# Patient Record
Sex: Male | Born: 1970 | Race: White | Hispanic: No | Smoking: Current every day smoker
Health system: Southern US, Community
[De-identification: ages and names within clinical notes are randomized; demographics above are authoritative.]

## PROBLEM LIST (undated history)

## (undated) DIAGNOSIS — Z22322 Carrier or suspected carrier of Methicillin resistant Staphylococcus aureus: Secondary | ICD-10-CM

## (undated) DIAGNOSIS — B182 Chronic viral hepatitis C: Secondary | ICD-10-CM

## (undated) DIAGNOSIS — F1110 Opioid abuse, uncomplicated: Secondary | ICD-10-CM

## (undated) DIAGNOSIS — G2581 Restless legs syndrome: Secondary | ICD-10-CM

## (undated) HISTORY — PX: HERNIA REPAIR: SHX51

## (undated) HISTORY — PX: APPENDECTOMY: SHX54

---

## 1999-05-02 ENCOUNTER — Emergency Department (HOSPITAL_COMMUNITY): Admission: EM | Admit: 1999-05-02 | Discharge: 1999-05-02 | Payer: Self-pay | Admitting: *Deleted

## 1999-05-08 ENCOUNTER — Emergency Department (HOSPITAL_COMMUNITY): Admission: EM | Admit: 1999-05-08 | Discharge: 1999-05-08 | Payer: Self-pay | Admitting: *Deleted

## 1999-11-10 ENCOUNTER — Emergency Department (HOSPITAL_COMMUNITY): Admission: EM | Admit: 1999-11-10 | Discharge: 1999-11-10 | Payer: Self-pay | Admitting: Emergency Medicine

## 2000-07-01 ENCOUNTER — Emergency Department (HOSPITAL_COMMUNITY): Admission: EM | Admit: 2000-07-01 | Discharge: 2000-07-01 | Payer: Self-pay | Admitting: Emergency Medicine

## 2001-07-28 ENCOUNTER — Emergency Department (HOSPITAL_COMMUNITY): Admission: EM | Admit: 2001-07-28 | Discharge: 2001-07-28 | Payer: Self-pay | Admitting: Emergency Medicine

## 2001-07-28 ENCOUNTER — Encounter: Payer: Self-pay | Admitting: Emergency Medicine

## 2001-08-16 ENCOUNTER — Emergency Department (HOSPITAL_COMMUNITY): Admission: EM | Admit: 2001-08-16 | Discharge: 2001-08-16 | Payer: Self-pay | Admitting: Emergency Medicine

## 2002-02-14 ENCOUNTER — Emergency Department (HOSPITAL_COMMUNITY): Admission: EM | Admit: 2002-02-14 | Discharge: 2002-02-14 | Payer: Self-pay | Admitting: Emergency Medicine

## 2012-11-03 ENCOUNTER — Encounter (HOSPITAL_COMMUNITY): Payer: Self-pay | Admitting: *Deleted

## 2012-11-03 ENCOUNTER — Emergency Department (INDEPENDENT_AMBULATORY_CARE_PROVIDER_SITE_OTHER)
Admission: EM | Admit: 2012-11-03 | Discharge: 2012-11-03 | Disposition: A | Discharge status: Home / Self Care | Payer: Self-pay | Source: Home / Self Care | Attending: Family Medicine | Admitting: Family Medicine

## 2012-11-03 DIAGNOSIS — H60399 Other infective otitis externa, unspecified ear: Secondary | ICD-10-CM

## 2012-11-03 DIAGNOSIS — K047 Periapical abscess without sinus: Secondary | ICD-10-CM

## 2012-11-03 DIAGNOSIS — H6090 Unspecified otitis externa, unspecified ear: Secondary | ICD-10-CM

## 2012-11-03 MED ORDER — NEOMYCIN-POLYMYXIN-HC 3.5-10000-1 OT SUSP: 4.0000 [drp] | Freq: Three times a day (TID) | OTIC | Status: DC

## 2012-11-03 MED ORDER — TRAMADOL HCL 50 MG PO TABS: 50.0000 mg | ORAL_TABLET | Freq: Four times a day (QID) | ORAL | Status: DC | PRN

## 2012-11-03 MED ORDER — IBUPROFEN 600 MG PO TABS: 600.0000 mg | ORAL_TABLET | Freq: Three times a day (TID) | ORAL | Status: DC

## 2012-11-03 MED ORDER — HYDROCORTISONE-ACETIC ACID 1-2 % OT SOLN: 4.0000 [drp] | Freq: Two times a day (BID) | OTIC | Status: DC

## 2012-11-03 MED ORDER — PENICILLIN V POTASSIUM 250 MG PO TABS: 500.0000 mg | ORAL_TABLET | Freq: Three times a day (TID) | ORAL | Status: AC

## 2012-11-03 NOTE — ED Notes (Signed)
Pt  Reports   Symptoms  Of  Earache     r         Side            As  Well  As   toothache  -   With  Sensation of  Pressure         Symptoms  Of  About 1  Week

## 2012-11-06 NOTE — ED Provider Notes (Signed)
History     CSN: [id number]  Arrival date & time 11/03/12  1238   First MD Initiated Contact with Patient 11/03/12 1301      Chief Complaint  Patient presents with  . Otalgia    (Consider location/radiation/quality/duration/timing/severity/associated sxs/prior treatment) HPI Comments: 42 y/o male here c/o left ear pain and drainage for 1 week. Also c/o dental pain in left lower jaw. Denies fever or chills. No h/o diabetes. No sore throat. No cough or congestion.    History reviewed. No pertinent past medical history.  History reviewed. No pertinent past surgical history.  No family history on file.  History  Substance Use Topics  . Smoking status: Not on file  . Smokeless tobacco: Not on file  . Alcohol Use: Not on file      Review of Systems  Constitutional: Negative for fever and chills.  HENT: Positive for ear pain, dental problem and ear discharge. Negative for congestion, sore throat and neck pain.   Eyes: Negative for discharge.  Respiratory: Negative for cough and shortness of breath.   Cardiovascular: Negative for chest pain.  Skin: Negative for rash.    Allergies  Review of patient's allergies indicates no known allergies.  Home Medications   Current Outpatient Rx  Name  Route  Sig  Dispense  Refill  . acetic acid-hydrocortisone (VOSOL-HC) otic solution   Left Ear   Place 4 drops into the left ear 2 (two) times daily.   10 mL   0   . ibuprofen (ADVIL,MOTRIN) 600 MG tablet   Oral   Take 1 tablet (600 mg total) by mouth 3 (three) times daily.   30 tablet   0   . neomycin-polymyxin-hydrocortisone (CORTISPORIN) 3.5-10000-1 otic suspension   Left Ear   Place 4 drops into the left ear 3 (three) times daily.   10 mL   0   . penicillin v potassium (VEETID) 250 MG tablet   Oral   Take 2 tablets (500 mg total) by mouth 3 (three) times daily.   42 tablet   0   . traMADol (ULTRAM) 50 MG tablet   Oral   Take 1 tablet (50 mg total) by mouth every 6  (six) hours as needed for pain.   15 tablet   0     BP 142/78  Pulse 72  Temp(Src) 98.6 F (37 C) (Oral)  Resp 16  SpO2 96%  Physical Exam  Nursing note and vitals reviewed. Constitutional: He is oriented to person, place, and time. He appears well-developed and well-nourished. No distress.  HENT:  Head: Normocephalic and atraumatic.  Right Ear: External ear normal.  Nose: Nose normal.  Mouth/Throat: Oropharynx is clear and moist.  Left external ear canal with erythema, mild swelling and abundant white/yellow adherent exudates. TM appears intact. No blood in ear canal.  There is a fractured molar with periapical swelling and exudate in left lower jaw.   Eyes: Conjunctivae and EOM are normal. Pupils are equal, round, and reactive to light. Right eye exhibits no discharge. Left eye exhibits no discharge.  Cardiovascular: Normal heart sounds.   Pulmonary/Chest: Effort normal and breath sounds normal. No respiratory distress. He has no wheezes. He has no rales. He exhibits no tenderness.  Lymphadenopathy:    He has no cervical adenopathy.  Neurological: He is alert and oriented to person, place, and time.  Skin: No rash noted.    ED Course  Procedures (including critical care time)  Labs Reviewed - No data to  display No results found.   1. External otitis   2. Dental abscess       MDM   Treated with VOSOL-HC alternating with cortisporin otic drops (sample provided). Penicillin V, ibuprofen and tramadol. reccommended dental follow up.  Supportive care and red flags that should prompt his return to medical attention discussed with patient and provided in writing.         Sharin Grave, MD 11/06/12 1010

## 2013-04-17 ENCOUNTER — Emergency Department (HOSPITAL_COMMUNITY)
Admission: EM | Admit: 2013-04-17 | Discharge: 2013-04-17 | Disposition: A | Discharge status: Home / Self Care | Payer: Self-pay | Attending: Emergency Medicine | Admitting: Emergency Medicine

## 2013-04-17 ENCOUNTER — Emergency Department (HOSPITAL_COMMUNITY): Payer: Self-pay

## 2013-04-17 ENCOUNTER — Encounter (HOSPITAL_COMMUNITY): Payer: Self-pay | Admitting: *Deleted

## 2013-04-17 DIAGNOSIS — F17200 Nicotine dependence, unspecified, uncomplicated: Secondary | ICD-10-CM | POA: Insufficient documentation

## 2013-04-17 DIAGNOSIS — M79609 Pain in unspecified limb: Secondary | ICD-10-CM | POA: Insufficient documentation

## 2013-04-17 DIAGNOSIS — M79671 Pain in right foot: Secondary | ICD-10-CM

## 2013-04-17 DIAGNOSIS — Z791 Long term (current) use of non-steroidal anti-inflammatories (NSAID): Secondary | ICD-10-CM | POA: Insufficient documentation

## 2013-04-17 MED ORDER — OXYCODONE-ACETAMINOPHEN 5-325 MG PO TABS: 2.0000 | ORAL_TABLET | Freq: Four times a day (QID) | ORAL | Status: DC | PRN

## 2013-04-17 MED ORDER — OXYCODONE-ACETAMINOPHEN 5-325 MG PO TABS
2.0000 | ORAL_TABLET | Freq: Once | ORAL | Status: AC
  Administered 2013-04-17: 2 via ORAL
  Filled 2013-04-17: qty 2

## 2013-04-17 NOTE — ED Provider Notes (Signed)
History  This chart was scribed for non-physician practitioner Roxy Horseman, PA-C working with Raeford Razor, MD, by Candelaria Stagers, ED Scribe. This patient was seen in room WTR5/WTR5 and the patient's care was started at 9:16 PM  CSN: [id number] Arrival date & time 04/17/13  2014  First MD Initiated Contact with Patient 04/17/13 2040     Chief Complaint  Patient presents with  . Leg Pain    rt    The history is provided by the patient. No language interpreter was used.   HPI Comments: Joseph Spence is a 42 y.o. male who presents to the Emergency Department complaining of right heel pain after he stomped on his cell phone in anger three days ago.  He reports moderate pain in the heel, as well as new pain to his upper leg, which he describes as sore muscles from "tip-toeing."  He has taken Percocet with relief, but has no remaining pills.  He has also taken ibuprofen and tylenol with no relief.   History reviewed. No pertinent past medical history. Past Surgical History  Procedure Laterality Date  . Appendectomy    . Hernia repair     No family history on file. History  Substance Use Topics  . Smoking status: Current Every Day Smoker  . Smokeless tobacco: Not on file  . Alcohol Use: No    Review of Systems  Musculoskeletal: Positive for arthralgias (right heel pain).  All other systems reviewed and are negative.    Allergies  Review of patient's allergies indicates no known allergies.  Home Medications   Current Outpatient Rx  Name  Route  Sig  Dispense  Refill  . acetaminophen (TYLENOL) 500 MG tablet   Oral   Take 500 mg by mouth every 6 (six) hours as needed for pain (pain).         Marland Kitchen ibuprofen (ADVIL,MOTRIN) 600 MG tablet   Oral   Take 1 tablet (600 mg total) by mouth 3 (three) times daily.   30 tablet   0    BP 124/80  Pulse 85  Temp(Src) 99.1 F (37.3 C) (Oral)  Resp 19  Ht 6\' 1"  (1.854 m)  Wt 150 lb (68.04 kg)  BMI 19.79 kg/m2  SpO2  96% Physical Exam  Nursing note and vitals reviewed. Constitutional: He is oriented to person, place, and time. He appears well-developed and well-nourished. No distress.  HENT:  Head: Normocephalic and atraumatic.  Eyes: EOM are normal.  Neck: Neck supple. No tracheal deviation present.  Cardiovascular: Normal rate and intact distal pulses.   Brisk cap refill   Pulmonary/Chest: Effort normal. No respiratory distress.  Musculoskeletal: Normal range of motion.  Calcaneal bump test positive.  Mild tenderness of the right ankle.  No swelling or bony abnormality or deformity.  Upper thigh mildly tender to palpation, but no swelling or abnormalities.  Neurological: He is alert and oriented to person, place, and time.  Skin: Skin is warm and dry.  Psychiatric: He has a normal mood and affect. His behavior is normal.    ED Course  Procedures  DIAGNOSTIC STUDIES: Oxygen Saturation is 96% on room air, normal by my interpretation.    COORDINATION OF CARE:  9:19 PM Discussed course of care with pt which includes boot and crutches, pain medication, and applying ice.  Pt request a work note.  Will provide referral to orthopaedist is sx persist.  Pt understands and agrees.   Labs Reviewed - No data to display Dg Ankle  Complete Right  04/17/2013   *RADIOLOGY REPORT*  Clinical Data: Heel pain.  Right ankle pain.  RIGHT ANKLE - COMPLETE 3+ VIEW  Comparison: None.  Findings: No acute bone or soft tissue abnormalities are present.  IMPRESSION: Negative right ankle and calcaneus.   Original Report Authenticated By: Marin Roberts, M.D.   1. Heel pain, right     MDM  Patient with heel pain, likely bruised. No fractures.  Will give crutches and pain meds.  Recommend RICE.  Patient is stable and ready for discharge.  I personally performed the services described in this documentation, which was scribed in my presence. The recorded information has been reviewed and is accurate.     Roxy Horseman, PA-C 04/17/13 2150

## 2013-04-17 NOTE — ED Notes (Signed)
Rt heel and leg pain x 3 days, "got mad and stomped cell phone with heel" now has heel pain and upper leg muscle pain

## 2013-04-21 NOTE — ED Provider Notes (Signed)
Medical screening examination/treatment/procedure(s) were performed by non-physician practitioner and as supervising physician I was immediately available for consultation/collaboration.  Raeford Razor, MD 04/21/13 [phone]

## 2013-04-22 ENCOUNTER — Encounter (HOSPITAL_COMMUNITY): Payer: Self-pay | Admitting: Emergency Medicine

## 2013-04-22 ENCOUNTER — Emergency Department (HOSPITAL_COMMUNITY): Payer: Self-pay

## 2013-04-22 ENCOUNTER — Emergency Department (HOSPITAL_COMMUNITY)
Admission: EM | Admit: 2013-04-22 | Discharge: 2013-04-22 | Disposition: A | Discharge status: Home / Self Care | Payer: Self-pay | Attending: Emergency Medicine | Admitting: Emergency Medicine

## 2013-04-22 DIAGNOSIS — X503XXA Overexertion from repetitive movements, initial encounter: Secondary | ICD-10-CM | POA: Insufficient documentation

## 2013-04-22 DIAGNOSIS — S92001S Unspecified fracture of right calcaneus, sequela: Secondary | ICD-10-CM

## 2013-04-22 DIAGNOSIS — Y9389 Activity, other specified: Secondary | ICD-10-CM | POA: Insufficient documentation

## 2013-04-22 DIAGNOSIS — F17200 Nicotine dependence, unspecified, uncomplicated: Secondary | ICD-10-CM | POA: Insufficient documentation

## 2013-04-22 DIAGNOSIS — Z79899 Other long term (current) drug therapy: Secondary | ICD-10-CM | POA: Insufficient documentation

## 2013-04-22 DIAGNOSIS — S92009A Unspecified fracture of unspecified calcaneus, initial encounter for closed fracture: Secondary | ICD-10-CM | POA: Insufficient documentation

## 2013-04-22 DIAGNOSIS — Y929 Unspecified place or not applicable: Secondary | ICD-10-CM | POA: Insufficient documentation

## 2013-04-22 MED ORDER — OXYCODONE-ACETAMINOPHEN 5-325 MG PO TABS
2.0000 | ORAL_TABLET | Freq: Once | ORAL | Status: AC
  Administered 2013-04-22: 2 via ORAL
  Filled 2013-04-22: qty 2

## 2013-04-22 MED ORDER — OXYCODONE-ACETAMINOPHEN 5-325 MG PO TABS: 2.0000 | ORAL_TABLET | Freq: Four times a day (QID) | ORAL | Status: DC | PRN

## 2013-04-22 NOTE — ED Provider Notes (Signed)
Medical screening examination/treatment/procedure(s) were performed by non-physician practitioner and as supervising physician I was immediately available for consultation/collaboration.   Deitrick Ferreri, MD 04/22/13 1844 

## 2013-04-22 NOTE — ED Notes (Signed)
Pt has rt heel pain and was seen for this on the 20th with no relief, denies any injury.

## 2013-04-22 NOTE — ED Provider Notes (Signed)
CSN: [id number]     Arrival date & time 04/22/13  1446 History    This chart was scribed for a non-physician practitioner, Roxy Horseman, PA-C, working with Rolan Bucco, MD by Frederik Pear, ED Scribe. This patient was seen in room WTR8/WTR8 and the patient's care was started at 1503.     First MD Initiated Contact with Patient 04/22/13 1503     Chief Complaint  Patient presents with  . Foot Pain   (Consider location/radiation/quality/duration/timing/severity/associated sxs/prior Treatment) The history is provided by the patient and medical records. No language interpreter was used.    HPI Comments: Joseph Spence is a 42 y.o. male who presents to the Emergency Department complaining of ongoing, constant, severe right foot pain that began at the bottom of his heel and has since spread up to his ankle after he stomped on his phone in anger 8 days ago. He denies new injury or trauma to the foot. He reports he has been elevating his foot, applying ice, using crutches, and using the brace he was given during his ED visit on 06/20. He reports he contacted an orthopedist following his last visit, but is not able to afford the visit.   No past medical history on file. Past Surgical History  Procedure Laterality Date  . Appendectomy    . Hernia repair     No family history on file. History  Substance Use Topics  . Smoking status: Current Every Day Smoker  . Smokeless tobacco: Not on file  . Alcohol Use: No    Review of Systems A complete 10 system review of systems was obtained and all systems are negative except as noted in the HPI and PMH.  Allergies  Review of patient's allergies indicates no known allergies.  Home Medications   Current Outpatient Rx  Name  Route  Sig  Dispense  Refill  . acetaminophen (TYLENOL) 500 MG tablet   Oral   Take 500 mg by mouth every 6 (six) hours as needed for pain (pain).         Marland Kitchen ibuprofen (ADVIL,MOTRIN) 600 MG tablet   Oral   Take 1  tablet (600 mg total) by mouth 3 (three) times daily.   30 tablet   0   . oxyCODONE-acetaminophen (PERCOCET/ROXICET) 5-325 MG per tablet   Oral   Take 2 tablets by mouth every 6 (six) hours as needed for pain.   12 tablet   0    BP 126/87  Pulse 75  Temp(Src) 98.8 F (37.1 C) (Oral)  Resp 14  SpO2 99% Physical Exam  Nursing note and vitals reviewed. Constitutional: He is oriented to person, place, and time. He appears well-developed and well-nourished. No distress.  HENT:  Head: Normocephalic and atraumatic.  Eyes: EOM are normal. Pupils are equal, round, and reactive to light.  Neck: Normal range of motion. Neck supple. No tracheal deviation present.  Cardiovascular: Normal rate and intact distal pulses.   Brisk capillary refill in the right great toe.   Pulmonary/Chest: Effort normal. No respiratory distress.  Abdominal: Soft. He exhibits no distension.  Musculoskeletal: Normal range of motion. He exhibits tenderness. He exhibits no edema.  Right ankle is moderately swollen and diffusely tender to palpation.   Neurological: He is alert and oriented to person, place, and time.  Skin: Skin is warm and dry.  Psychiatric: He has a normal mood and affect. His behavior is normal.   ED Course   Procedures (including critical care time)  DIAGNOSTIC  STUDIES: Oxygen Saturation is 99% on room air, normal by my interpretation.    COORDINATION OF CARE:  15:45- Discussed planned course of treatment with the patient, including a repeat X-ray of the right foot to evaluate for a stress fracture that may have not manifested on prior X-ray, who is agreeable at this time.  16:35- Consult with supervising physician, Dr. Rolan Bucco, to determine if a CT is needed. She does not recommend a CT, but recommends a posterior splint to the right ankle.  16:44- Shared stress fracture found on the X-ray with the pt. Discussed the plan including following up with an orthopedist, refilling the  pain control medication, and applying a posterior ankle brace. He is agreeable at this time.   Labs Reviewed - No data to display Dg Ankle Complete Right  04/22/2013   *RADIOLOGY REPORT*  Clinical Data: Pain at heel, stepped on a hard object 1 week ago  RIGHT ANKLE - COMPLETE 3+ VIEW  Comparison: 04/17/2013 Correlation:  Right calcaneus radiographs07/16/2014  Findings: Osseous mineralization normal. Ankle mortise intact. Question mild soft tissue swelling. Linear lucency identified at the plantar aspect of the posterior calcaneus, not definitely seen on prior foot or ankle radiographs. This could represent an artifact though an atypical fracture is not completely excluded. No additional focal bony abnormalities identified.  IMPRESSION: Short linear lucency newly identified at the plantar aspect of the posterior calcaneus, potentially an artifact though a subtle atypical fracture is not completely excluded.   Original Report Authenticated By: Ulyses Southward, M.D.   1. Calcaneal fracture, right, sequela     MDM  Mild calcaneal fracture.  Posterior splint, pain meds, ortho follow-up.  I personally performed the services described in this documentation, which was scribed in my presence. The recorded information has been reviewed and is accurate.     Roxy Horseman, PA-C 04/22/13 1732

## 2014-03-10 ENCOUNTER — Emergency Department (HOSPITAL_COMMUNITY)
Admission: EM | Admit: 2014-03-10 | Discharge: 2014-03-10 | Disposition: A | Discharge status: Home / Self Care | Payer: Self-pay | Attending: Emergency Medicine | Admitting: Emergency Medicine

## 2014-03-10 ENCOUNTER — Encounter (HOSPITAL_COMMUNITY): Payer: Self-pay | Admitting: Emergency Medicine

## 2014-03-10 DIAGNOSIS — L03317 Cellulitis of buttock: Principal | ICD-10-CM

## 2014-03-10 DIAGNOSIS — L0291 Cutaneous abscess, unspecified: Secondary | ICD-10-CM

## 2014-03-10 DIAGNOSIS — F17200 Nicotine dependence, unspecified, uncomplicated: Secondary | ICD-10-CM | POA: Insufficient documentation

## 2014-03-10 DIAGNOSIS — L0231 Cutaneous abscess of buttock: Secondary | ICD-10-CM | POA: Insufficient documentation

## 2014-03-10 MED ORDER — SULFAMETHOXAZOLE-TRIMETHOPRIM 800-160 MG PO TABS: 1.0000 | ORAL_TABLET | Freq: Two times a day (BID) | ORAL | Status: DC

## 2014-03-10 MED ORDER — CEPHALEXIN 500 MG PO CAPS: 500.0000 mg | ORAL_CAPSULE | Freq: Four times a day (QID) | ORAL | Status: DC

## 2014-03-10 MED ORDER — OXYCODONE-ACETAMINOPHEN 5-325 MG PO TABS
1.0000 | ORAL_TABLET | Freq: Four times a day (QID) | ORAL | Status: DC | PRN
Start: 2014-03-10 — End: 2014-03-20

## 2014-03-10 NOTE — ED Provider Notes (Signed)
CSN: [id number]     Arrival date & time 03/10/14  1647 History  This chart was scribed for non-physician practitioner, Joseph LowerVrinda Kota Ciancio, FNP,working with Joseph ArgyleForrest S Harrison, MD, by Joseph PlumberJennifer Spence, ED Scribe.  This patient was seen in room WTR5/WTR5 and the patient's care was started at 5:09 PM.  Chief Complaint  Patient presents with  . Animal Bite   HPI HPI Comments:  Joseph Spence is a 43 y.o. male who presents to the Emergency Department complaining of several bites on his right 5th digit, left leg, buttocks and back. He states he did not see what bit him. He states that the bite on his buttock started out as a rash. He reports picking and squeezing the areas. He denies fever or chills. He states his tetanus vaccination is UTD. He denies any allergies to any medications.  History reviewed. No pertinent past medical history. Past Surgical History  Procedure Laterality Date  . Appendectomy    . Hernia repair     No family history on file. History  Substance Use Topics  . Smoking status: Current Every Day Smoker  . Smokeless tobacco: Not on file  . Alcohol Use: No    Review of Systems  Constitutional: Negative for fever and chills.  Skin: Positive for rash.       Abscesses  All other systems reviewed and are negative.   Allergies  Review of patient's allergies indicates no known allergies.  Home Medications   Prior to Admission medications   Medication Sig Start Date End Date Taking? Authorizing Provider  acetaminophen (TYLENOL) 500 MG tablet Take 500 mg by mouth every 6 (six) hours as needed for pain (pain).    Historical Provider, MD  ibuprofen (ADVIL,MOTRIN) 600 MG tablet Take 1 tablet (600 mg total) by mouth 3 (three) times daily. 11/03/12   Joseph Moreno-Coll, MD  oxyCODONE-acetaminophen (PERCOCET/ROXICET) 5-325 MG per tablet Take 2 tablets by mouth every 6 (six) hours as needed for pain. 04/17/13   Joseph Horsemanobert Browning, PA-C  oxyCODONE-acetaminophen (PERCOCET/ROXICET) 5-325  MG per tablet Take 2 tablets by mouth every 6 (six) hours as needed for pain. 04/22/13   Joseph Horsemanobert Browning, PA-C   Triage Vitals: BP 126/80  Pulse 88  Temp(Src) 98.2 F (36.8 C) (Oral)  Resp 18  SpO2 98% Physical Exam  Nursing note and vitals reviewed. Constitutional: He is oriented to person, place, and time. He appears well-developed and well-nourished.  HENT:  Head: Normocephalic and atraumatic.  Eyes: EOM are normal.  Neck: Normal range of motion.  Cardiovascular: Normal rate.   Pulmonary/Chest: Effort normal.  Musculoskeletal: Normal range of motion.  Neurological: He is alert and oriented to person, place, and time.  Skin: Skin is warm and dry. Rash noted.  Swelling on the dorsal aspect of right hand on proximal area of fifth digit. Small erythematous area on left thigh. Pt has firm erythematous area to the lower left back. Pt has area of raw skin  Psychiatric: He has a normal mood and affect. His behavior is normal.    ED Course  Procedures (including critical care time) DIAGNOSTIC STUDIES: Oxygen Saturation is 98% on RA, normal by my interpretation.   COORDINATION OF CARE: 5:15 PM- Will I & D abscess on buttock. Will prescribe antibiotics and pain medication. Pt verbalizes understanding and agrees to plan.  INCISION AND DRAINAGE PROCEDURE NOTE: Patient identification was confirmed and verbal consent was obtained. This procedure was performed by Joseph LowerVrinda Malacai Grantz, FNP at 5:19 PM. Site: left buttock Sterile procedures  observed Needle size: 25 G Anesthetic used (type and amt): Lidocaine 2% with Epinephrine (2 mL) Blade size: #11 Drainage: small Complexity: Complex Packing used: 1/4 inch Iodoform Site anesthetized, incision made over site, wound drained and explored loculations, rinsed with copious amounts of normal saline, wound packed with sterile gauze, covered with dry, sterile dressing.  Pt tolerated procedure well without complications.  Instructions for  care discussed verbally and pt provided with additional written instructions for homecare and f/u.   Medications - No data to display  Labs Review Labs Reviewed - No data to display  Imaging Review No results found.   EKG Interpretation None      MDM   Final diagnoses:  Abscess    Will treat for infection as erythematous around abscess. Pt has area to the right hand and thigh that appear to be early:no need for abscess  I personally performed the services described in this documentation, which was scribed in my presence. The recorded information has been reviewed and is accurate.    Joseph LowerVrinda Marieanne Marxen, NP 03/10/14 1737

## 2014-03-10 NOTE — ED Notes (Signed)
Per pt, states he noticed a rash last Sunday on his lower back-now has raised area with a black head-several other bites on upper/lower extremities-right hand swollen

## 2014-03-10 NOTE — ED Provider Notes (Signed)
Medical screening examination/treatment/procedure(s) were performed by non-physician practitioner and as supervising physician I was immediately available for consultation/collaboration.   EKG Interpretation None        Remedy Corporan S Diesha Rostad, MD 03/10/14 1847 

## 2014-03-10 NOTE — Discharge Instructions (Signed)
Abscess An abscess is an infected area that contains a collection of pus and debris.It can occur in almost any part of the body. An abscess is also known as a furuncle or boil. CAUSES  An abscess occurs when tissue gets infected. This can occur from blockage of oil or sweat glands, infection of hair follicles, or a minor injury to the skin. As the body tries to fight the infection, pus collects in the area and creates pressure under the skin. This pressure causes pain. People with weakened immune systems have difficulty fighting infections and get certain abscesses more often.  SYMPTOMS Usually an abscess develops on the skin and becomes a painful mass that is red, warm, and tender. If the abscess forms under the skin, you may feel a moveable soft area under the skin. Some abscesses break open (rupture) on their own, but most will continue to get worse without care. The infection can spread deeper into the body and eventually into the bloodstream, causing you to feel ill.  DIAGNOSIS  Your caregiver will take your medical history and perform a physical exam. A sample of fluid may also be taken from the abscess to determine what is causing your infection. TREATMENT  Your caregiver may prescribe antibiotic medicines to fight the infection. However, taking antibiotics alone usually does not cure an abscess. Your caregiver may need to make a small cut (incision) in the abscess to drain the pus. In some cases, gauze is packed into the abscess to reduce pain and to continue draining the area. HOME CARE INSTRUCTIONS   Only take over-the-counter or prescription medicines for pain, discomfort, or fever as directed by your caregiver.  If you were prescribed antibiotics, take them as directed. Finish them even if you start to feel better.  If gauze is used, follow your caregiver's directions for changing the gauze.  To avoid spreading the infection:  Keep your draining abscess covered with a  bandage.  Wash your hands well.  Do not share personal care items, towels, or whirlpools with others.  Avoid skin contact with others.  Keep your skin and clothes clean around the abscess.  Keep all follow-up appointments as directed by your caregiver. SEEK MEDICAL CARE IF:   You have increased pain, swelling, redness, fluid drainage, or bleeding.  You have muscle aches, chills, or a general ill feeling.  You have a fever. MAKE SURE YOU:   Understand these instructions.  Will watch your condition.  Will get help right away if you are not doing well or get worse. Document Released: 06/25/2005 Document Revised: 03/16/2012 Document Reviewed: 11/28/2011 [employer] Patient Information 2014 [employer].  

## 2014-03-12 ENCOUNTER — Inpatient Hospital Stay (HOSPITAL_COMMUNITY)
Admission: EM | Admit: 2014-03-12 | Discharge: 2014-03-20 | DRG: 603 | Disposition: A | Discharge status: Home / Self Care | Payer: Self-pay | Attending: Internal Medicine | Admitting: Internal Medicine

## 2014-03-12 ENCOUNTER — Encounter (HOSPITAL_COMMUNITY): Payer: Self-pay | Admitting: Emergency Medicine

## 2014-03-12 DIAGNOSIS — L0390 Cellulitis, unspecified: Secondary | ICD-10-CM

## 2014-03-12 DIAGNOSIS — L03113 Cellulitis of right upper limb: Secondary | ICD-10-CM | POA: Diagnosis present

## 2014-03-12 DIAGNOSIS — R509 Fever, unspecified: Secondary | ICD-10-CM | POA: Diagnosis present

## 2014-03-12 DIAGNOSIS — E876 Hypokalemia: Secondary | ICD-10-CM | POA: Diagnosis present

## 2014-03-12 DIAGNOSIS — L0291 Cutaneous abscess, unspecified: Secondary | ICD-10-CM

## 2014-03-12 DIAGNOSIS — L0231 Cutaneous abscess of buttock: Secondary | ICD-10-CM | POA: Diagnosis present

## 2014-03-12 DIAGNOSIS — L03011 Cellulitis of right finger: Secondary | ICD-10-CM | POA: Diagnosis present

## 2014-03-12 DIAGNOSIS — L03019 Cellulitis of unspecified finger: Secondary | ICD-10-CM

## 2014-03-12 DIAGNOSIS — L02519 Cutaneous abscess of unspecified hand: Secondary | ICD-10-CM | POA: Diagnosis present

## 2014-03-12 DIAGNOSIS — D638 Anemia in other chronic diseases classified elsewhere: Secondary | ICD-10-CM | POA: Diagnosis present

## 2014-03-12 DIAGNOSIS — F17200 Nicotine dependence, unspecified, uncomplicated: Secondary | ICD-10-CM | POA: Diagnosis present

## 2014-03-12 DIAGNOSIS — A4902 Methicillin resistant Staphylococcus aureus infection, unspecified site: Secondary | ICD-10-CM | POA: Diagnosis present

## 2014-03-12 DIAGNOSIS — L03119 Cellulitis of unspecified part of limb: Principal | ICD-10-CM

## 2014-03-12 DIAGNOSIS — L03317 Cellulitis of buttock: Secondary | ICD-10-CM

## 2014-03-12 DIAGNOSIS — Z807 Family history of other malignant neoplasms of lymphoid, hematopoietic and related tissues: Secondary | ICD-10-CM

## 2014-03-12 DIAGNOSIS — A419 Sepsis, unspecified organism: Secondary | ICD-10-CM | POA: Diagnosis present

## 2014-03-12 DIAGNOSIS — F1111 Opioid abuse, in remission: Secondary | ICD-10-CM | POA: Diagnosis present

## 2014-03-12 DIAGNOSIS — B182 Chronic viral hepatitis C: Secondary | ICD-10-CM | POA: Diagnosis present

## 2014-03-12 DIAGNOSIS — L02419 Cutaneous abscess of limb, unspecified: Principal | ICD-10-CM | POA: Diagnosis present

## 2014-03-12 DIAGNOSIS — R7989 Other specified abnormal findings of blood chemistry: Secondary | ICD-10-CM | POA: Diagnosis present

## 2014-03-12 LAB — COMPREHENSIVE METABOLIC PANEL
ALT: 69 U/L — AB (ref 0–53)
AST: 50 U/L — ABNORMAL HIGH (ref 0–37)
Albumin: 3 g/dL — ABNORMAL LOW (ref 3.5–5.2)
Alkaline Phosphatase: 70 U/L (ref 39–117)
BUN: 8 mg/dL (ref 6–23)
CALCIUM: 8.5 mg/dL (ref 8.4–10.5)
CO2: 23 mEq/L (ref 19–32)
Chloride: 96 mEq/L (ref 96–112)
Creatinine, Ser: 0.63 mg/dL (ref 0.50–1.35)
GFR calc non Af Amer: >90 mL/min (ref >90)
GLUCOSE: 87 mg/dL (ref 70–99)
Potassium: 3.3 mEq/L — ABNORMAL LOW (ref 3.7–5.3)
SODIUM: 132 meq/L — AB (ref 137–147)
TOTAL PROTEIN: 7 g/dL (ref 6.0–8.3)
Total Bilirubin: 0.5 mg/dL (ref 0.3–1.2)

## 2014-03-12 LAB — CBC WITH DIFFERENTIAL/PLATELET
BASOS ABS: 0 10*3/uL (ref 0.0–0.1)
Basophils Relative: 0 % (ref 0–1)
EOS ABS: 0 10*3/uL (ref 0.0–0.7)
EOS PCT: 0 % (ref 0–5)
HCT: 37.6 % — ABNORMAL LOW (ref 39.0–52.0)
Hemoglobin: 12.6 g/dL — ABNORMAL LOW (ref 13.0–17.0)
LYMPHS ABS: 2.4 10*3/uL (ref 0.7–4.0)
Lymphocytes Relative: 16 % (ref 12–46)
MCH: 31 pg (ref 26.0–34.0)
MCHC: 33.5 g/dL (ref 30.0–36.0)
MCV: 92.4 fL (ref 78.0–100.0)
Monocytes Absolute: 1.5 10*3/uL — ABNORMAL HIGH (ref 0.1–1.0)
Monocytes Relative: 9 % (ref 3–12)
Neutro Abs: 11.5 10*3/uL — ABNORMAL HIGH (ref 1.7–7.7)
Neutrophils Relative %: 75 % (ref 43–77)
PLATELETS: 303 10*3/uL (ref 150–400)
RBC: 4.07 MIL/uL — ABNORMAL LOW (ref 4.22–5.81)
RDW: 13.6 % (ref 11.5–15.5)
WBC: 15.5 10*3/uL — ABNORMAL HIGH (ref 4.0–10.5)

## 2014-03-12 LAB — I-STAT CG4 LACTIC ACID, ED: Lactic Acid, Venous: 0.69 mmol/L (ref 0.5–2.2)

## 2014-03-12 MED ORDER — VANCOMYCIN HCL IN DEXTROSE 1-5 GM/200ML-% IV SOLN
1000.0000 mg | Freq: Three times a day (TID) | INTRAVENOUS | Status: DC
  Administered 2014-03-13 – 2014-03-14 (×5): 1000 mg via INTRAVENOUS
  Filled 2014-03-12 (×5): qty 200

## 2014-03-12 MED ORDER — ACETAMINOPHEN 325 MG PO TABS
650.0000 mg | ORAL_TABLET | Freq: Once | ORAL | Status: AC
Start: 2014-03-12 — End: 2014-03-12
  Administered 2014-03-12: 650 mg via ORAL
  Filled 2014-03-12: qty 2

## 2014-03-12 MED ORDER — ONDANSETRON HCL 4 MG/2ML IJ SOLN
4.0000 mg | Freq: Once | INTRAMUSCULAR | Status: AC
Start: 2014-03-12 — End: 2014-03-12
  Administered 2014-03-12: 4 mg via INTRAVENOUS
  Filled 2014-03-12: qty 2

## 2014-03-12 MED ORDER — SODIUM CHLORIDE 0.9 % IV SOLN
3.0000 g | Freq: Four times a day (QID) | INTRAVENOUS | Status: DC
  Administered 2014-03-13 (×4): 3 g via INTRAVENOUS
  Filled 2014-03-12 (×4): qty 3

## 2014-03-12 MED ORDER — MORPHINE SULFATE 4 MG/ML IJ SOLN
4.0000 mg | Freq: Once | INTRAMUSCULAR | Status: AC
  Administered 2014-03-12: 4 mg via INTRAVENOUS
  Filled 2014-03-12: qty 1

## 2014-03-12 MED ORDER — CLINDAMYCIN PHOSPHATE 600 MG/50ML IV SOLN
600.0000 mg | Freq: Once | INTRAVENOUS | Status: AC
Start: 2014-03-12 — End: 2014-03-12
  Administered 2014-03-12: 600 mg via INTRAVENOUS
  Filled 2014-03-12: qty 50

## 2014-03-12 NOTE — H&P (Addendum)
[facility] History and Physical  Joseph Spence RUE:[id number] DOB: [birth year] DOA: 03/12/2014  Referring physician:  Dr Wilkie Aye PCP: No primary provider on file.   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 He was discharged home on Keflex and Bactrim. Patient reports that 9 days back he had gone camping and shortly after returning from there he noted some erythema and small pustules over the left gluteal area. 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 For past 2 days he also noticed it edema and swelling over his right hand that started over the little finger and extended over the dorsum of his hand. He was unable to flex his fingers and the site became more painful. He also noticed audible he has a redness over his legs forming small pustules for past 2 days. He denies any fever or chills. He denies any sick contacts. Denies any dysuria or urethral discharge. Denies any nausea vomiting or diarrhea. He reports having a monogamous relationship with his wife and being tested for HIV recently. Patient denies headache, dizziness, fever, chills, nausea , vomiting, chest pain, palpitations, SOB, abdominal pain, bowel or urinary symptoms. Denies change in weight or appetite.  Course in the ED Patient was febrile to 100.9 degree Fahrenheit. Remaining vitals were stable. Blood work done showed WBC of 15.5k, hemoglobin of 12.6, hematocrit of 37.6 and platelets of 303. Chemistry showed sodium of 132, potassium of 3.3 with normal renal function. -Left gluteal abscess was I&D by ED physician and wound culture sent. Blood  culture was sent and patient given a dose of IV clindamycin. Hospital is called for admission to medical floor.  Review of Systems:  Constitutional: Denies fever, chills, diaphoresis, appetite change. Reports fatigue.  HEENT: Denies photophobia, eye pain, redness, hearing loss, ear pain, congestion, sore throat, rhinorrhea, sneezing, mouth sores, trouble swallowing, neck pain, neck stiffness  Respiratory: Denies SOB, DOE, cough, chest tightness,  and wheezing.   Cardiovascular: Denies chest pain, palpitations and leg swelling.  Gastrointestinal: Denies nausea, vomiting, abdominal pain, diarrhea, constipation, blood in stool and abdominal distention.  Genitourinary: Denies dysuria, urgency, frequency, hematuria, flank pain and difficulty urinating.  Endocrine: Denies: hot or cold intolerance,  polyuria, polydipsia. Musculoskeletal: Pain and swelling over right hand and right little finger. Pain and redness over left buttock and diffuse small erythema over the legs.  Denies myalgias, back pain, joint swelling, arthralgias and gait problem.  Neurological: Denies dizziness, seizures, syncope, weakness, light-headedness, numbness and headaches.  Hematological: Denies adenopathy.  Psychiatric/Behavioral: Denies  confusion,   History reviewed. No pertinent past medical history. Past Surgical History  Procedure Laterality Date  . Appendectomy    . Hernia repair     Social History:  reports that he has been smoking.  He does not have any smokeless tobacco history on file. He reports that he does not drink alcohol or use illicit drugs.  No Known Allergies  History reviewed. No pertinent family history.  Prior to Admission medications   Medication Sig Start Date End Date Taking? Authorizing Provider  cephALEXin (KEFLEX) 500 MG capsule Take 1  capsule (500 mg total) by mouth 4 (four) times daily. 03/10/14  Yes Teressa LowerVrinda Pickering, NP  oxyCODONE-acetaminophen (PERCOCET/ROXICET) 5-325 MG per tablet Take  1-2 tablets by mouth every 6 (six) hours as needed for moderate pain or severe pain. 03/10/14  Yes Teressa LowerVrinda Pickering, NP  sulfamethoxazole-trimethoprim (SEPTRA DS) 800-160 MG per tablet Take 1 tablet by mouth every 12 (twelve) hours. 03/10/14  Yes Teressa LowerVrinda Pickering, NP     Physical Exam:  Filed Vitals:   03/12/14 1952 03/12/14 2140 03/12/14 2148  BP: 129/79  123/59  Pulse: 92  97  Temp: 99.7 F (37.6 C) 100.9 F (38.3 C)   TempSrc: Oral Oral   Resp: 19  18  Height: 5\' 11"  (1.803 m)    Weight: 68.04 kg (150 lb)    SpO2: 95%  94%    Constitutional: Vital signs reviewed.  Patient is a well-developed and well-nourished in no acute distress. HEENT: no pallor, no icterus, moist oral mucosa, no cervical lymphadenopathy Cardiovascular: RRR, S1 normal, S2 normal, no MRG Chest: CTAB, no wheezes, rales, or rhonchi Abdominal: Soft. Non-tender, non-distended, bowel sounds are normal, no masses, organomegaly, or guarding present.  GU: no CVA tenderness Extremities :Drained abscess over her left gluteal area with surrounding erythema with small pustules around it. Indurated area with Tender to pressure.  Multiple small 5 mm pustules spreading diffusely to the left lower thighs and leg. Has a few similar pustules over the right thigh. Swelling of the right hand a week fluctuation over the dorsum of the right hand with a small pus point without any discharge. Swelling of the right little finger with a small pus point as well . Warm and very tender to palpation. Tender to pressure over right of this joint. Patient unable to fully flex his fingers . No needle marks seen . Neurological: A&O x3, non focal  Labs on Admission:  Basic Metabolic Panel:  Recent Labs Lab 03/12/14 2050  NA 132*  K 3.3*  CL 96  CO2 23  GLUCOSE 87  BUN 8  CREATININE 0.63  CALCIUM 8.5   Liver Function Tests:  Recent Labs Lab 03/12/14 2050  AST 50*  ALT 69*  ALKPHOS 70  BILITOT 0.5  PROT 7.0  ALBUMIN 3.0*   No  results found for this basename: LIPASE, AMYLASE,  in the last 168 hours No results found for this basename: AMMONIA,  in the last 168 hours CBC:  Recent Labs Lab 03/12/14 2050  WBC 15.5*  NEUTROABS 11.5*  HGB 12.6*  HCT 37.6*  MCV 92.4  PLT 303   Cardiac Enzymes: No results found for this basename: CKTOTAL, CKMB, CKMBINDEX, TROPONINI,  in the last 168 hours BNP: No components found with this basename: POCBNP,  CBG: No results found for this basename: GLUCAP,  in the last 168 hours  Radiological Exams on Admission:    Assessment/Plan  Principal Problem: Sepsis with left gluteal abscess and diffuse skin pustules Patient hemodynamically stable for admission to medical floor. --given disseminated areas of pustules and involvement of multiple skin areas with abscesses involving buttock and right hand with hx of IV drug use, staph bacteremia is of concern -Left gluteal abscess drained by ED physician and cultures sent. Blood cultures sent from the ED. Patient received a dose of IV clindamycin. I would place him on IV vancomycin and Unasyn. I would add IV Flagyl for anaerobic coverage as well. Patient denies similar symptoms in the past but does report a history of IV drug use (last used  was 3 months back). -Follow blood culture.  supportive care with IV fluids and Tylenol . Pain control with when necessary Vicodin and IV morphine for severe pain.  -Check gonorhea and Chlamydia in urine, UA , urine cx and urine drug screen. Check HIV ab and hepatitis panel.. -Patient may need surgical consult for evaluation of left gluteal abscess if symptoms not improved or has persistent drainage. Also recommend further w/up and  ID consult depending upon culture results. . Active Problems:     Cellulitis with possible abscess over right hand and little finger  Given swelling and fluctuation and severe tenderness over the area, appears to have underlying abscess as well. I have spoken with hand  surgery consult Dr Mina MarbleWeingold who will see the patient. Hold off on imaging at this time.  Hypokalemia Replenish with oral potassium.  Tobacco abuse Counseled on smoking cessation     Diet:NPO  DVT prophylaxis: sq lovenox   Code Status: full code discussed with wife at bedside Disposition Plan: home once imroved  Zaryiah Barz [facility] Pager [phone]  Total time spent on admission :70 minutes  If 7PM-7AM, please contact night-coverage [email or web address] Password [email or web address] 03/12/2014, 11:52 PM

## 2014-03-12 NOTE — ED Notes (Signed)
Pt aware of need for urine sample.  

## 2014-03-12 NOTE — ED Provider Notes (Signed)
CSN: [id number]     Arrival date & time 03/12/14  1941 History   First MD Initiated Contact with Patient 03/12/14 2013     Chief Complaint  Patient presents with  . Abscess     (Consider location/radiation/quality/duration/timing/severity/associated sxs/prior Treatment) HPI  This 43 year old male with no significant past medical history who presents with recurrent abscess. A shunt was seen and evaluated on Friday at that time he was noted to have multiple areas of induration and redness over his hand, left leg, and buttock. Buttock abscess was I&D at bedside. Patient was given Keflex and Bactrim for evidence of surrounding cellulitis. Patient reports worsening of buttock abscess as well as increasing size of right fifth digit redness. He also reports multiple pustules throughout his body. He denies any fevers. He generally states he doesn't feel well. He denies any urethral discharge or concern for STD. He reports that he is in monogamous relationship.  History reviewed. No pertinent past medical history. Past Surgical History  Procedure Laterality Date  . Appendectomy    . Hernia repair     History reviewed. No pertinent family history. History  Substance Use Topics  . Smoking status: Current Every Day Smoker  . Smokeless tobacco: Not on file  . Alcohol Use: No    Review of Systems  Constitutional: Negative.  Negative for fever and chills.  Respiratory: Negative.  Negative for cough, chest tightness and shortness of breath.   Cardiovascular: Negative.  Negative for chest pain.  Gastrointestinal: Negative.  Negative for nausea, vomiting, abdominal pain and diarrhea.  Genitourinary: Negative.  Negative for dysuria and discharge.  Musculoskeletal: Negative for back pain.  Skin: Positive for color change. Negative for rash.       Multiple worsening abscesses  Neurological: Negative for headaches.  All other systems reviewed and are negative.     Allergies  Review of patient's  allergies indicates no known allergies.  Home Medications   Prior to Admission medications   Medication Sig Start Date End Date Taking? Authorizing Provider  cephALEXin (KEFLEX) 500 MG capsule Take 1 capsule (500 mg total) by mouth 4 (four) times daily. 03/10/14  Yes Teressa LowerVrinda Pickering, NP  oxyCODONE-acetaminophen (PERCOCET/ROXICET) 5-325 MG per tablet Take 1-2 tablets by mouth every 6 (six) hours as needed for moderate pain or severe pain. 03/10/14  Yes Teressa LowerVrinda Pickering, NP  sulfamethoxazole-trimethoprim (SEPTRA DS) 800-160 MG per tablet Take 1 tablet by mouth every 12 (twelve) hours. 03/10/14  Yes Teressa LowerVrinda Pickering, NP   BP 123/59  Pulse 97  Temp(Src) 100.9 F (38.3 C) (Oral)  Resp 18  Ht 5\' 11"  (1.803 m)  Wt 150 lb (68.04 kg)  BMI 20.93 kg/m2  SpO2 94% Physical Exam  Nursing note and vitals reviewed. Constitutional: He is oriented to person, place, and time. No distress.  Poor hygiene, smells of smoke  HENT:  Head: Normocephalic and atraumatic.  Mouth/Throat: Oropharynx is clear and moist.  Eyes: Pupils are equal, round, and reactive to light.  Cardiovascular: Normal rate, regular rhythm and normal heart sounds.   No murmur heard. Pulmonary/Chest: Effort normal and breath sounds normal. No respiratory distress. He has no wheezes.  Abdominal: Soft. Bowel sounds are normal. There is no tenderness. There is no rebound.  Musculoskeletal: He exhibits no edema.  Lymphadenopathy:    He has no cervical adenopathy.  Neurological: He is alert and oriented to person, place, and time.  Skin: Skin is warm and dry.  Abscess #1:, Left buttock just adjacent to the gluteal fold,  large quarter size ulcerative lesion with spontaneous drainage of pus, local induration and cellulitis adjacent to lesion #2: Dime size area of fluctuance just distal to the right fifth MCP joint, open wound noted with spontaneous drainage Multiple other pustules noted with one just proximal to the left knee  Psychiatric:  He has a normal mood and affect.    ED Course  INCISION AND DRAINAGE Date/Time: 03/12/2014 10:37 PM Performed by: Ross MarcusHORTON, Dasie Chancellor, F Authorized by: Ross MarcusHORTON, Adrijana Haros, F Consent: Verbal consent obtained. Risks and benefits: risks, benefits and alternatives were discussed Consent given by: patient Patient identity confirmed: verbally with patient Type: abscess Location: buttock. Anesthesia: local infiltration Local anesthetic: lidocaine 1% without epinephrine Anesthetic total: 10 ml Patient sedated: no Scalpel size: 11 Incision type: single straight Complexity: simple Drainage: purulent Drainage amount: copious Wound treatment: drain placed Packing material: 1/2 in iodoform gauze Patient tolerance: Patient tolerated the procedure well with no immediate complications.   (including critical care time) Labs Review Labs Reviewed  CBC WITH DIFFERENTIAL - Abnormal; Notable for the following:    WBC 15.5 (*)    RBC 4.07 (*)    Hemoglobin 12.6 (*)    HCT 37.6 (*)    Neutro Abs 11.5 (*)    Monocytes Absolute 1.5 (*)    All other components within normal limits  COMPREHENSIVE METABOLIC PANEL - Abnormal; Notable for the following:    Sodium 132 (*)    Potassium 3.3 (*)    Albumin 3.0 (*)    AST 50 (*)    ALT 69 (*)    All other components within normal limits  CULTURE, BLOOD (ROUTINE X 2)  CULTURE, BLOOD (ROUTINE X 2)  GC/CHLAMYDIA PROBE AMP  CULTURE, ROUTINE-ABSCESS  URINALYSIS, ROUTINE W REFLEX MICROSCOPIC  I-STAT CG4 LACTIC ACID, ED    Imaging Review No results found.   EKG Interpretation None      MDM   Final diagnoses:  Abscess and cellulitis  Fever    Patient presents with multiple pustular lesions with the largest on the left buttock. Initial temperature of 99.7. Denies fevers at home. Given the extensive nature of the lesion, concern for bacteremia. The buttock abscess was I&D to the bedside and culture sent. Blood cultures were sent and patient was given  clindamycin. He has failed outpatient Bactrim and Keflex which he states that he is taking as directed. Repeat temperature 100.7. Patient given Tylenol. Lactic acid added as well urinalysis was GC Chlamydia probe.  Leukocytosis to 15.5. Given fever, leukocytosis, and multiple areas of abscess of cellulitis concerning for disseminated disease, will admit for IV antibiotics. May need to broaden to vancomycin but will defer to hospitalist.    Shon Batonourtney F Jonluke Cobbins, MD 03/12/14 2238

## 2014-03-12 NOTE — ED Notes (Addendum)
Dr Dhungel at bedside 

## 2014-03-12 NOTE — ED Notes (Signed)
Pt states that he doesn't have to use the restroom right now but will try again later

## 2014-03-12 NOTE — ED Notes (Signed)
Pt arrived to the ED with a complaint of abscess on the hand and left leg.  Pt was seen here on Friday and had an I&D on his buttocks.  Pt has since developed swelling on his upper left leg and right hand particularly his pinkie finger.

## 2014-03-13 ENCOUNTER — Encounter (HOSPITAL_COMMUNITY)
Admission: EM | Disposition: A | Discharge status: Home / Self Care | Payer: Self-pay | Source: Home / Self Care | Attending: Internal Medicine

## 2014-03-13 ENCOUNTER — Encounter (HOSPITAL_COMMUNITY): Payer: Self-pay | Admitting: General Surgery

## 2014-03-13 ENCOUNTER — Other Ambulatory Visit: Payer: Self-pay | Admitting: Orthopedic Surgery

## 2014-03-13 ENCOUNTER — Inpatient Hospital Stay (HOSPITAL_COMMUNITY): Payer: Self-pay

## 2014-03-13 DIAGNOSIS — L0231 Cutaneous abscess of buttock: Secondary | ICD-10-CM

## 2014-03-13 DIAGNOSIS — L03317 Cellulitis of buttock: Secondary | ICD-10-CM

## 2014-03-13 LAB — BASIC METABOLIC PANEL
BUN: 9 mg/dL (ref 6–23)
CO2: 27 mEq/L (ref 19–32)
Calcium: 8.4 mg/dL (ref 8.4–10.5)
Chloride: 101 mEq/L (ref 96–112)
Creatinine, Ser: 0.72 mg/dL (ref 0.50–1.35)
Glucose, Bld: 100 mg/dL — ABNORMAL HIGH (ref 70–99)
Potassium: 3.5 mEq/L — ABNORMAL LOW (ref 3.7–5.3)
SODIUM: 140 meq/L (ref 137–147)

## 2014-03-13 LAB — CBC
HCT: 36.1 % — ABNORMAL LOW (ref 39.0–52.0)
HEMOGLOBIN: 12.4 g/dL — AB (ref 13.0–17.0)
MCH: 31.3 pg (ref 26.0–34.0)
MCHC: 34.3 g/dL (ref 30.0–36.0)
MCV: 91.2 fL (ref 78.0–100.0)
PLATELETS: 297 10*3/uL (ref 150–400)
RBC: 3.96 MIL/uL — ABNORMAL LOW (ref 4.22–5.81)
RDW: 13.4 % (ref 11.5–15.5)
WBC: 13.7 10*3/uL — ABNORMAL HIGH (ref 4.0–10.5)

## 2014-03-13 LAB — SEDIMENTATION RATE: SED RATE: 31 mm/h — AB (ref 0–16)

## 2014-03-13 LAB — URINALYSIS, ROUTINE W REFLEX MICROSCOPIC
BILIRUBIN URINE: NEGATIVE
Glucose, UA: NEGATIVE mg/dL
Hgb urine dipstick: NEGATIVE
Ketones, ur: NEGATIVE mg/dL
LEUKOCYTES UA: NEGATIVE
NITRITE: NEGATIVE
PH: 6.5 (ref 5.0–8.0)
Protein, ur: NEGATIVE mg/dL
SPECIFIC GRAVITY, URINE: 1.01 (ref 1.005–1.030)
Urobilinogen, UA: 1 mg/dL (ref 0.0–1.0)

## 2014-03-13 LAB — HEPATITIS PANEL, ACUTE
HCV AB: REACTIVE — AB
HEP A IGM: NONREACTIVE
HEP B C IGM: NONREACTIVE
HEP B S AG: NEGATIVE

## 2014-03-13 LAB — SURGICAL PCR SCREEN
MRSA, PCR: POSITIVE — AB
STAPHYLOCOCCUS AUREUS: POSITIVE — AB

## 2014-03-13 LAB — C-REACTIVE PROTEIN: CRP: 9.8 mg/dL — AB (ref <0.60)

## 2014-03-13 LAB — GC/CHLAMYDIA PROBE AMP
CT PROBE, AMP APTIMA: NEGATIVE
GC PROBE AMP APTIMA: NEGATIVE

## 2014-03-13 LAB — HIV ANTIBODY (ROUTINE TESTING W REFLEX): HIV: NONREACTIVE

## 2014-03-13 SURGERY — IRRIGATION AND DEBRIDEMENT EXTREMITY
Anesthesia: General | Laterality: Right

## 2014-03-13 MED ORDER — ONDANSETRON HCL 4 MG PO TABS: 4.0000 mg | ORAL_TABLET | Freq: Four times a day (QID) | ORAL | Status: DC | PRN

## 2014-03-13 MED ORDER — POTASSIUM CHLORIDE CRYS ER 20 MEQ PO TBCR
40.0000 meq | EXTENDED_RELEASE_TABLET | Freq: Once | ORAL | Status: DC
  Filled 2014-03-13: qty 2

## 2014-03-13 MED ORDER — ACETAMINOPHEN 650 MG RE SUPP: 650.0000 mg | Freq: Four times a day (QID) | RECTAL | Status: DC | PRN

## 2014-03-13 MED ORDER — HYDROCODONE-ACETAMINOPHEN 5-325 MG PO TABS
2.0000 | ORAL_TABLET | ORAL | Status: DC | PRN
  Administered 2014-03-13 – 2014-03-20 (×25): 2 via ORAL
  Filled 2014-03-13 (×25): qty 2

## 2014-03-13 MED ORDER — ENOXAPARIN SODIUM 40 MG/0.4ML ~~LOC~~ SOLN
40.0000 mg | Freq: Every day | SUBCUTANEOUS | Status: DC
  Administered 2014-03-13 – 2014-03-19 (×8): 40 mg via SUBCUTANEOUS
  Filled 2014-03-13 (×9): qty 0.4

## 2014-03-13 MED ORDER — NICOTINE 14 MG/24HR TD PT24
14.0000 mg | MEDICATED_PATCH | Freq: Every day | TRANSDERMAL | Status: DC
  Administered 2014-03-13 – 2014-03-20 (×8): 14 mg via TRANSDERMAL
  Filled 2014-03-13 (×8): qty 1

## 2014-03-13 MED ORDER — ONDANSETRON HCL 4 MG/2ML IJ SOLN: 4.0000 mg | Freq: Four times a day (QID) | INTRAMUSCULAR | Status: DC | PRN

## 2014-03-13 MED ORDER — MORPHINE SULFATE 2 MG/ML IJ SOLN
2.0000 mg | INTRAMUSCULAR | Status: DC | PRN
  Administered 2014-03-13 – 2014-03-17 (×15): 2 mg via INTRAVENOUS
  Filled 2014-03-13 (×15): qty 1

## 2014-03-13 MED ORDER — METRONIDAZOLE IN NACL 5-0.79 MG/ML-% IV SOLN
500.0000 mg | Freq: Three times a day (TID) | INTRAVENOUS | Status: DC
  Administered 2014-03-13 – 2014-03-20 (×23): 500 mg via INTRAVENOUS
  Filled 2014-03-13 (×24): qty 100

## 2014-03-13 MED ORDER — ACETAMINOPHEN 325 MG PO TABS
650.0000 mg | ORAL_TABLET | Freq: Four times a day (QID) | ORAL | Status: DC | PRN
  Administered 2014-03-17: 650 mg via ORAL
  Filled 2014-03-13: qty 2

## 2014-03-13 MED ORDER — PIPERACILLIN-TAZOBACTAM 3.375 G IVPB
3.3750 g | Freq: Three times a day (TID) | INTRAVENOUS | Status: DC
  Administered 2014-03-13 – 2014-03-20 (×20): 3.375 g via INTRAVENOUS
  Filled 2014-03-13 (×21): qty 50

## 2014-03-13 MED ORDER — MORPHINE SULFATE 2 MG/ML IJ SOLN
2.0000 mg | Freq: Four times a day (QID) | INTRAMUSCULAR | Status: DC | PRN
  Administered 2014-03-13 (×2): 2 mg via INTRAVENOUS
  Filled 2014-03-13 (×2): qty 1

## 2014-03-13 MED ORDER — SODIUM CHLORIDE 0.9 % IV SOLN
INTRAVENOUS | Status: DC
  Administered 2014-03-13 – 2014-03-17 (×4): via INTRAVENOUS
  Administered 2014-03-18: 75 mL/h via INTRAVENOUS
  Administered 2014-03-19 (×2): via INTRAVENOUS

## 2014-03-13 MED ORDER — HYDROCODONE-ACETAMINOPHEN 5-325 MG PO TABS
2.0000 | ORAL_TABLET | Freq: Four times a day (QID) | ORAL | Status: DC | PRN
  Administered 2014-03-13 (×2): 2 via ORAL
  Filled 2014-03-13 (×2): qty 2

## 2014-03-13 NOTE — Progress Notes (Signed)
Pt is MRSA positive via PCR.  Notified MD, placed on contact precautions.  Spoke with Gen Surgery and Ortho Surg, no OR today, ok to advance diet. Spoke with Dr. Blake DivineAkula regarding pts pain, new orders placed. Also pt requests Nicotine patch. Will cont to assess.

## 2014-03-13 NOTE — Progress Notes (Signed)
ANTIBIOTIC CONSULT NOTE - INITIAL  Pharmacy Consult for Vancomycin/Zosyn Indication: Left Gluteal abscess with sepsis/ R hand cellulitis/absces    No Known Allergies  Patient Measurements: Height: 5\' 11"  (180.3 cm) Weight: 150 lb (68.04 kg) IBW/kg (Calculated) : 75.3   Vital Signs: Temp: 98.4 F (36.9 C) (06/15 1400) Temp src: Oral (06/15 1400) BP: 116/64 mmHg (06/15 1400) Pulse Rate: 75 (06/15 1400) Intake/Output from previous day: 06/14 0701 - 06/15 0700 In: 600 [IV Piggyback:600] Out: 700 [Urine:700] Intake/Output from this shift:    Labs:  Recent Labs  03/12/14 2050 03/13/14 0505 03/13/14 1230  WBC 15.5*  --  13.7*  HGB 12.6*  --  12.4*  PLT 303  --  297  CREATININE 0.63 0.72  --    Estimated Creatinine Clearance: 116.9 ml/min (by C-G formula based on Cr of 0.72). No results found for this basename: VANCOTROUGH, Leodis BinetVANCOPEAK, VANCORANDOM, GENTTROUGH, GENTPEAK, GENTRANDOM, TOBRATROUGH, TOBRAPEAK, TOBRARND, AMIKACINPEAK, AMIKACINTROU, AMIKACIN,  in the last 72 hours   Microbiology: Recent Results (from the past 720 hour(s))  CULTURE, ROUTINE-ABSCESS     Status: None   Collection Time    03/12/14 10:03 PM      Result Value Ref Range Status   Specimen Description WOUND BUTTOCKS   Final   Special Requests NONE   Final   Gram Stain     Final   Value: MODERATE WBC PRESENT, PREDOMINANTLY PMN     RARE SQUAMOUS EPITHELIAL CELLS PRESENT     MODERATE GRAM POSITIVE COCCI IN PAIRS     IN CLUSTERS     Performed at [facility]   Culture PENDING   Incomplete   Report Status PENDING   Incomplete  GC/CHLAMYDIA PROBE AMP     Status: None   Collection Time    03/13/14 12:03 AM      Result Value Ref Range Status   CT Probe RNA NEGATIVE  NEGATIVE Final   GC Probe RNA NEGATIVE  NEGATIVE Final   Comment: (NOTE)                                                                                               **Normal Reference Range: Negative**          Assay performed  using the Gen-Probe APTIMA COMBO2 (R) Assay.     Acceptable specimen types for this assay include APTIMA Swabs (Unisex,     endocervical, urethral, or vaginal), first void urine, and ThinPrep     liquid based cytology samples.     Performed at [facility]  SURGICAL PCR SCREEN     Status: Abnormal   Collection Time    03/13/14  9:56 AM      Result Value Ref Range Status   MRSA, PCR POSITIVE (*) NEGATIVE Final   Comment: RESULT CALLED TO, READ BACK BY AND VERIFIED WITH:     RODRIGUEZ,A @ 1141 ON [id number] BY POTEAT,S   Staphylococcus aureus POSITIVE (*) NEGATIVE Final   Comment:            The Xpert SA Assay (FDA     approved for NASAL  specimens     in patients over 41 years of age),     is one component of     a comprehensive surveillance     program.  Test performance has     been validated by [facility] for patients greater     than or equal to 41 year old.     It is not intended     to diagnose infection nor to     guide or monitor treatment.    Medical History: History reviewed. No pertinent past medical history.  Medications:  Scheduled:  . enoxaparin (LOVENOX) injection  40 mg Subcutaneous QHS  . metronidazole  500 mg Intravenous 3 times per day  . nicotine  14 mg Transdermal Daily  . piperacillin-tazobactam (ZOSYN)  IV  3.375 g Intravenous Q8H  . [START ON 03/14/2014] potassium chloride  40 mEq Oral Once  . vancomycin  1,000 mg Intravenous Q8H   Infusions:  . sodium chloride 75 mL/hr at 03/13/14 0040   PRN: acetaminophen, acetaminophen, HYDROcodone-acetaminophen, morphine injection, ondansetron (ZOFRAN) IV, ondansetron Assessment: 43 yo M with hx IV drug use in past 3 months, admitted with left gluteal abscess/Right hand cellulitis/absecess  with sepsis.  Concern for staph bacteremia, Echo ordered. Broad spectrum antibiotics with Vancomycin/Zosyn/Flagyl.  I&D Planned for 6/16 am.    Antimicrobials  6/14 >> Clindamycin x 1 in ER 6/15 >> Unasyn >>  6/15 6/15 >> Vancomycin >>  6/15 >> Zosyn >>  6/15 >> Flagyl >>   Labs WBC elevated 13.7, improved after start of abx Temp: AF Scr is WNL, CrCl > 100 ml/min   Microbiology  6/14 Blood x 2: Sent 6/14 Wound buttocks: Pending 6/15: MRSA PCR screen: Postive  6/15: GC/Chlamydia: Negative   Goal of Therapy:  Vancomycin trough level 15-20 mcg/ml  Plan:  1.) Continue Vancomycin 1 gram IV q8h 2.) Start Zosyn 3.375 grams IV q8h, infuse over 4 hours 3.) Patient also on flagyl 500 mg IV q8h - Consider discontinue if not for C.diff - patient receiving double anaerobic coverage with flagyl and zosyn.  4.) Monitor renal function, f/u cultures, vancomycin trough as needed 5.) F/u Echo results, r/o staph bacteremia    Malky Rudzinski, Loma MessingMary Patricia PharmD Pager #: [phone] 8:22 PM 03/13/2014

## 2014-03-13 NOTE — Progress Notes (Signed)
ANTIBIOTIC CONSULT NOTE - INITIAL  Pharmacy Consult for vancomycin, unasyn Indication: gluteal abscess and cellulitis   No Known Allergies  Patient Measurements: Height: 5\' 11"  (180.3 cm) Weight: 150 lb (68.04 kg) IBW/kg (Calculated) : 75.3 Adjusted Body Weight:   Vital Signs: Temp: 99.3 F (37.4 C) (06/15 0037) Temp src: Oral (06/15 0037) BP: 119/72 mmHg (06/15 0037) Pulse Rate: 93 (06/15 0037) Intake/Output from previous day: 06/14 0701 - 06/15 0700 In: -  Out: 700 [Urine:700] Intake/Output from this shift: Total I/O In: -  Out: 700 [Urine:700]  Labs:  Recent Labs  03/12/14 2050  WBC 15.5*  HGB 12.6*  PLT 303  CREATININE 0.63   Estimated Creatinine Clearance: 116.9 ml/min (by C-G formula based on Cr of 0.63). No results found for this basename: VANCOTROUGH, Leodis BinetVANCOPEAK, VANCORANDOM, GENTTROUGH, GENTPEAK, GENTRANDOM, TOBRATROUGH, TOBRAPEAK, TOBRARND, AMIKACINPEAK, AMIKACINTROU, AMIKACIN,  in the last 72 hours   Microbiology: Recent Results (from the past 720 hour(s))  CULTURE, ROUTINE-ABSCESS     Status: None   Collection Time    03/12/14 10:03 PM      Result Value Ref Range Status   Specimen Description WOUND BUTTOCKS   Final   Special Requests NONE   Final   Gram Stain     Final   Value: MODERATE WBC PRESENT, PREDOMINANTLY PMN     RARE SQUAMOUS EPITHELIAL CELLS PRESENT     MODERATE GRAM POSITIVE COCCI IN PAIRS     IN CLUSTERS     Performed at [facility]   Culture PENDING   Incomplete   Report Status PENDING   Incomplete    Medical History: History reviewed. No pertinent past medical history.  Medications:  Anti-infectives   Start     Dose/Rate Route Frequency Ordered Stop   03/13/14 0045  metroNIDAZOLE (FLAGYL) IVPB 500 mg     500 mg 100 mL/hr over 60 Minutes Intravenous 3 times per day 03/13/14 0030     03/13/14 0000  vancomycin (VANCOCIN) IVPB 1000 mg/200 mL premix     1,000 mg 200 mL/hr over 60 Minutes Intravenous Every 8 hours  03/12/14 2351     03/13/14 0000  Ampicillin-Sulbactam (UNASYN) 3 g in sodium chloride 0.9 % 100 mL IVPB     3 g 100 mL/hr over 60 Minutes Intravenous Every 6 hours 03/12/14 2354     03/12/14 2045  clindamycin (CLEOCIN) IVPB 600 mg     600 mg 100 mL/hr over 30 Minutes Intravenous  Once 03/12/14 2032 03/12/14 2200     Assessment: Patient with gluteal abscess and cellulitis.  Goal of Therapy:  Vancomycin trough level 15-20 mcg/ml  Plan:  Measure antibiotic drug levels at steady state Follow up culture results Vancomycin 1gm iv q8hr Unasyn 3gm iv q6hr  Darlina GuysGrimsley Jr, Jacquenette ShoneJulian Crowford 03/13/2014,3:37 AM

## 2014-03-13 NOTE — Consult Note (Addendum)
Reason for Consult: left gluteal abscess Referring Physician: Dr. Venia Minks   HPI: Joseph Spence is a healthy 43 year old male with a history significant for IVD use and tobacco use admitted with cellulitis of left buttocks and right thumb.  We have been asked to see the patient for the gluteal abscess.  Duration of symptoms is 9 days.  Onset was gradual.  Coarse is worsened.  Moderate in severity. Initially began as a rash, boil which was pruritic.  The patient scratched the area which further irritated it.  He was seen in the ED on Friday at which point it was I&D.  He was sent home with keflex and bactrim.  He returned with erythema, swelling and pain to his right hand.  He was been seen by Dr. Burney Gauze.  He is on IV antibiotics; Vanc, unasyn, flagyl.  He reports chills, sweats, fever of 100.9.  Denies previous symptoms.  Denies history of diabetes.  White count today has decreased to 13.7K.     History reviewed. No pertinent past medical history.  Past Surgical History  Procedure Laterality Date  . Appendectomy    . Hernia repair      Family History  Problem Relation Age of Onset  . Lymphoma Father     Social History:  reports that he has been smoking Cigarettes.  He has been smoking about 2.00 packs per day. He does not have any smokeless tobacco history on file. He reports that he does not drink alcohol or use illicit drugs.  Allergies: No Known Allergies  Medications:  Scheduled Meds: . ampicillin-sulbactam (UNASYN) IV  3 g Intravenous Q6H  . enoxaparin (LOVENOX) injection  40 mg Subcutaneous QHS  . metronidazole  500 mg Intravenous 3 times per day  . nicotine  14 mg Transdermal Daily  . [START ON 03/14/2014] potassium chloride  40 mEq Oral Once  . vancomycin  1,000 mg Intravenous Q8H   Continuous Infusions: . sodium chloride 75 mL/hr at 03/13/14 0040   PRN Meds:.acetaminophen, acetaminophen, HYDROcodone-acetaminophen, morphine injection, ondansetron (ZOFRAN) IV,  ondansetron   Results for orders placed during the hospital encounter of 03/12/14 (from the past 48 hour(s))  CBC WITH DIFFERENTIAL     Status: Abnormal   Collection Time    03/12/14  8:50 PM      Result Value Ref Range   WBC 15.5 (*) 4.0 - 10.5 K/uL   RBC 4.07 (*) 4.22 - 5.81 MIL/uL   Hemoglobin 12.6 (*) 13.0 - 17.0 g/dL   HCT 37.6 (*) 39.0 - 52.0 %   MCV 92.4  78.0 - 100.0 fL   MCH 31.0  26.0 - 34.0 pg   MCHC 33.5  30.0 - 36.0 g/dL   RDW 13.6  11.5 - 15.5 %   Platelets 303  150 - 400 K/uL   Neutrophils Relative % 75  43 - 77 %   Neutro Abs 11.5 (*) 1.7 - 7.7 K/uL   Lymphocytes Relative 16  12 - 46 %   Lymphs Abs 2.4  0.7 - 4.0 K/uL   Monocytes Relative 9  3 - 12 %   Monocytes Absolute 1.5 (*) 0.1 - 1.0 K/uL   Eosinophils Relative 0  0 - 5 %   Eosinophils Absolute 0.0  0.0 - 0.7 K/uL   Basophils Relative 0  0 - 1 %   Basophils Absolute 0.0  0.0 - 0.1 K/uL  COMPREHENSIVE METABOLIC PANEL     Status: Abnormal   Collection Time  03/12/14  8:50 PM      Result Value Ref Range   Sodium 132 (*) 137 - 147 mEq/L   Potassium 3.3 (*) 3.7 - 5.3 mEq/L   Chloride 96  96 - 112 mEq/L   CO2 23  19 - 32 mEq/L   Glucose, Bld 87  70 - 99 mg/dL   BUN 8  6 - 23 mg/dL   Creatinine, Ser 0.63  0.50 - 1.35 mg/dL   Calcium 8.5  8.4 - 10.5 mg/dL   Total Protein 7.0  6.0 - 8.3 g/dL   Albumin 3.0 (*) 3.5 - 5.2 g/dL   AST 50 (*) 0 - 37 U/L   ALT 69 (*) 0 - 53 U/L   Alkaline Phosphatase 70  39 - 117 U/L   Total Bilirubin 0.5  0.3 - 1.2 mg/dL   GFR calc non Af Amer >90  >90 mL/min   GFR calc Af Amer >90  >90 mL/min   Comment: (NOTE)     The eGFR has been calculated using the CKD EPI equation.     This calculation has not been validated in all clinical situations.     eGFR's persistently <90 mL/min signify possible Chronic Kidney     Disease.  CULTURE, ROUTINE-ABSCESS     Status: None   Collection Time    03/12/14 10:03 PM      Result Value Ref Range   Specimen Description WOUND BUTTOCKS      Special Requests NONE     Gram Stain       Value: MODERATE WBC PRESENT, PREDOMINANTLY PMN     RARE SQUAMOUS EPITHELIAL CELLS PRESENT     MODERATE GRAM POSITIVE COCCI IN PAIRS     IN CLUSTERS     Performed at [facility]   Culture PENDING     Report Status PENDING    HEPATITIS PANEL, ACUTE     Status: Abnormal   Collection Time    03/12/14 10:52 PM      Result Value Ref Range   Hepatitis B Surface Ag NEGATIVE  NEGATIVE   HCV Ab Reactive (*) NEGATIVE   Comment: (NOTE)                                                                               This test is for screening purposes only.  Reactive results should be     confirmed by an alternative method.  Suggest HCV Qualitative, PCR,     test code 83130.  Specimens will be stable for reflex testing up to 3     days after collection.   Hep A IgM NON REACTIVE  NON REACTIVE   Hep B C IgM NON REACTIVE  NON REACTIVE   Comment: (NOTE)     High levels of Hepatitis B Core IgM antibody are detectable     during the acute stage of Hepatitis B. This antibody is used     to differentiate current from past HBV infection.     Performed at [place] [facility], ED     Status: None   Collection Time    03/12/14 10:59 PM  Result Value Ref Range   Lactic Acid, Venous 0.69  0.5 - 2.2 mmol/L  URINALYSIS, ROUTINE W REFLEX MICROSCOPIC     Status: None   Collection Time    03/13/14 12:00 AM      Result Value Ref Range   Color, Urine YELLOW  YELLOW   APPearance CLEAR  CLEAR   Specific Gravity, Urine 1.010  1.005 - 1.030   pH 6.5  5.0 - 8.0   Glucose, UA NEGATIVE  NEGATIVE mg/dL   Hgb urine dipstick NEGATIVE  NEGATIVE   Bilirubin Urine NEGATIVE  NEGATIVE   Ketones, ur NEGATIVE  NEGATIVE mg/dL   Protein, ur NEGATIVE  NEGATIVE mg/dL   Urobilinogen, UA 1.0  0.0 - 1.0 mg/dL   Nitrite NEGATIVE  NEGATIVE   Leukocytes, UA NEGATIVE  NEGATIVE   Comment: MICROSCOPIC NOT DONE ON URINES WITH NEGATIVE PROTEIN, BLOOD,  LEUKOCYTES, NITRITE, OR GLUCOSE <1000 mg/dL.  BASIC METABOLIC PANEL     Status: Abnormal   Collection Time    03/13/14  5:05 AM      Result Value Ref Range   Sodium 140  137 - 147 mEq/L   Comment: DELTA CHECK NOTED     REPEATED TO VERIFY   Potassium 3.5 (*) 3.7 - 5.3 mEq/L   Chloride 101  96 - 112 mEq/L   CO2 27  19 - 32 mEq/L   Glucose, Bld 100 (*) 70 - 99 mg/dL   BUN 9  6 - 23 mg/dL   Creatinine, Ser 0.72  0.50 - 1.35 mg/dL   Calcium 8.4  8.4 - 10.5 mg/dL   GFR calc non Af Amer >90  >90 mL/min   GFR calc Af Amer >90  >90 mL/min   Comment: (NOTE)     The eGFR has been calculated using the CKD EPI equation.     This calculation has not been validated in all clinical situations.     eGFR's persistently <90 mL/min signify possible Chronic Kidney     Disease.  SEDIMENTATION RATE     Status: Abnormal   Collection Time    03/13/14  5:05 AM      Result Value Ref Range   Sed Rate 31 (*) 0 - 16 mm/hr  C-REACTIVE PROTEIN     Status: Abnormal   Collection Time    03/13/14  5:05 AM      Result Value Ref Range   CRP 9.8 (*) <0.60 mg/dL   Comment: Performed at [facility]     Status: Abnormal   Collection Time    03/13/14  9:56 AM      Result Value Ref Range   MRSA, PCR POSITIVE (*) NEGATIVE   Comment: RESULT CALLED TO, READ BACK BY AND VERIFIED WITH:     RODRIGUEZ,A @ 1141 ON [id number] BY POTEAT,S   Staphylococcus aureus POSITIVE (*) NEGATIVE   Comment:            The Xpert SA Assay (FDA     approved for NASAL specimens     in patients over 4 years of age),     is one component of     a comprehensive surveillance     program.  Test performance has     been validated by [employer] for patients greater     than or equal to 58 year old.     It is not intended     to diagnose infection nor  to     guide or monitor treatment.    No results found.  Review of Systems  Cardiovascular: Positive for claudication.  All other systems reviewed and  are negative.  Blood pressure 113/78, pulse 99, temperature 97.6 F (36.4 C), temperature source Oral, resp. rate 20, height 5' 11"  (1.803 m), weight 150 lb (68.04 kg), SpO2 97.00%. Physical Exam  Constitutional: He is oriented to person, place, and time. He appears well-developed and well-nourished. No distress.  HENT:  Head: Normocephalic and atraumatic.  Neck: Normal range of motion. Neck supple.  Cardiovascular: Normal rate, regular rhythm, normal heart sounds and intact distal pulses.  Exam reveals no gallop and no friction rub.   No murmur heard. Respiratory: Effort normal and breath sounds normal. No respiratory distress. He has no wheezes. He has no rales. He exhibits no tenderness.  GI: Soft. Bowel sounds are normal. He exhibits no distension and no mass. There is no tenderness. There is no rebound and no guarding.  Musculoskeletal: Normal range of motion. He exhibits no edema and no tenderness.  Lymphadenopathy:    He has no cervical adenopathy.  Neurological: He is alert and oriented to person, place, and time.  Skin: Skin is warm. He is not diaphoretic.  3x2cm wound, adequately draining, surrounding erythema and induration.    Psychiatric: He has a normal mood and affect. His behavior is normal. Judgment and thought content normal.    Assessment/Plan: Left gluteal abscess: consider changing unasyn, flagyl to zosyn.  Agree with Vancomycin.  The abscess appears to be draining adequately at present time.  He has significant surrounding erythema and induration, will need to monitor this closely as he may need further debridement. If no improvement.  May resume diet, NPO after midnight.   Consider echocardiogram to evaluate for vegetation given drug use history, right hand cellulitis and new left digit erythema, will leave this up to primary service.  Start BID wet to dry dressing changes.  Follow cultures.  Thank you for the consult, will follow along.  Erby Pian  ANP-BC 03/13/2014, 12:36 PM   03/29/14 [place] did not write this consult note.  I personally examined the patient and all aspects of HPI, physical exam and assessment and plan were completed by me, Jessey Huyett, ANP-BC

## 2014-03-13 NOTE — Progress Notes (Signed)
[facility] PROGRESS NOTE  Joseph Spence UJW:[id number] DOB: Jul 08, 1971 DOA: 03/12/2014 PCP: No primary provider on file. 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 He was discharged home on Keflex and Bactrim. Patient reports that 9 days back he had gone camping and shortly after returning from there he noted some erythema and small pustules over the left gluteal area. 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 For past 2 days he also noticed it edema and swelling over his right hand that started over the little finger and extended over the dorsum of his hand. He was unable to flex his fingers and the site became more painful. He reports seeing pustules for the ast 2 days. In ED, he was febrile and he underwent blood cultures and I&D of the left gluteal abscess. He was started on IV antibiotics. Surgery and orthopedics consulted.   Assessment/Plan:  Left gluteal abscess with sepsis: given disseminated areas of pustules and involvement of multiple skin areas with abscesses involving buttock and right hand with hx of IV drug use, staph bacteremia is of concern , echocardiogram ordered as he reports IV drug use 3 months ago. Follow blood cultures.  -Left gluteal abscess drained by ED physician and cultures sent. Blood cultures sent from the ED. Patient received a dose of IV clindamycin.he is currently on  IV vancomycin and IV zosyn with IV flagyl. HIV ab negative. HCV  Antibody positive.  Surgery consulted a nd I&D scheduled for am.     Cellulitis with possible abscess over right hand and little finger  Given swelling and fluctuation and severe tenderness over the area, appears to have  underlying abscess as well.  hand surgery consult Dr Mina MarbleWeingold  will see the patient.  No surgical intervention.   Hypokalemia:  Will be repleted as needed.   Tobacco abuse - nicotine patch ordered.   DVT prophylaxis   Leukocytosis: Secondary to the sepsis.    Anemia- normocytic. Anemia panel will be obtained.    Hepatitis C antibody positive.  Elevated liver function tests - h/o drug abuse, hcv RNA quan ordered.         Code Status: full code Family Communication: NONE AT BEDSIDE Disposition Plan remain inpatient.    Consultants:  Surgery  Hand surgery- orthpedics.   Procedures:  I&D  Antibiotics:  Vancomycin6 /15  Zosyn 6/15  unasyn 6/14 till 6/15  Clindamycin 6/14 one dose  Flagyl 6/15  HPI/Subjective: Pain is better controlled. Wants to eat.   Objective: Filed Vitals:   03/13/14 1400  BP: 116/64  Pulse: 75  Temp: 98.4 F (36.9 C)  Resp: 18    Intake/Output Summary (Last 24 hours) at 03/13/14 1826 Last data filed at 03/13/14 1400  Gross per 24 hour  Intake    600 ml  Output   1400 ml  Net   -800 ml   Filed Weights   03/12/14 1952  Weight: 68.04 kg (150 lb)    Exam:   General:  Alert comfortable,   Cardiovascular: s1s2  Respiratory: ctab  Abdomen: soft NT ND BS+ Musculoskeletal: Multiple small 5 mm pustules spreading diffusely to the left lower thighs and leg.  Swelling of the right hand a week fluctuation over the dorsum of the right hand with a small pus point without any discharge. Swelling of the right little finger with a small pus point as well . Warm and very tender to palpation. Tender to pressure over right of this joint. Patient unable to fully flex his fingers . No needle marks seen .      Data Reviewed: Basic Metabolic Panel:  Recent Labs Lab 03/12/14 2050 03/13/14 0505  NA 132* 140  K 3.3* 3.5*  CL 96 101  CO2 23 27  GLUCOSE 87 100*  BUN 8 9  CREATININE 0.63 0.72  CALCIUM 8.5 8.4   Liver  Function Tests:  Recent Labs Lab 03/12/14 2050  AST 50*  ALT 69*  ALKPHOS 70  BILITOT 0.5  PROT 7.0  ALBUMIN 3.0*   No results found for this basename: LIPASE, AMYLASE,  in the last 168 hours No results found for this basename: AMMONIA,  in the last 168 hours CBC:  Recent Labs Lab 03/12/14 2050 03/13/14 1230  WBC 15.5* 13.7*  NEUTROABS 11.5*  --   HGB 12.6* 12.4*  HCT 37.6* 36.1*  MCV 92.4 91.2  PLT 303 297   Cardiac Enzymes: No results found for this basename: CKTOTAL, CKMB, CKMBINDEX, TROPONINI,  in the last 168 hours BNP (last 3 results) No results found for this basename: PROBNP,  in the last 8760 hours CBG: No results found for this basename: GLUCAP,  in the last 168 hours  Recent Results (from the past 240 hour(s))  CULTURE, ROUTINE-ABSCESS     Status: None   Collection Time    03/12/14 10:03 PM      Result Value Ref Range Status   Specimen Description WOUND BUTTOCKS   Final   Special Requests NONE   Final   Gram Stain     Final   Value: MODERATE WBC PRESENT, PREDOMINANTLY PMN     RARE SQUAMOUS EPITHELIAL CELLS PRESENT     MODERATE GRAM POSITIVE COCCI IN PAIRS     IN CLUSTERS     Performed at [facility]   Culture PENDING   Incomplete   Report Status PENDING   Incomplete  GC/CHLAMYDIA PROBE AMP     Status: None   Collection Time    03/13/14 12:03 AM      Result Value Ref Range Status   CT Probe RNA NEGATIVE  NEGATIVE Final   GC Probe RNA NEGATIVE  NEGATIVE Final   Comment: (NOTE)                                                                                               **Normal Reference Range: Negative**          Assay performed using the Gen-Probe APTIMA COMBO2 (R) Assay.     Acceptable specimen types for this assay include APTIMA Swabs (Unisex,     endocervical, urethral, or vaginal), first void urine, and ThinPrep     liquid based cytology samples.     Performed at [facility]  SURGICAL PCR SCREEN     Status:  Abnormal    Collection Time    03/13/14  9:56 AM      Result Value Ref Range Status   MRSA, PCR POSITIVE (*) NEGATIVE Final   Comment: RESULT CALLED TO, READ BACK BY AND VERIFIED WITH:     RODRIGUEZ,A @ 1141 ON [id number] BY POTEAT,S   Staphylococcus aureus POSITIVE (*) NEGATIVE Final   Comment:            The Xpert SA Assay (FDA     approved for NASAL specimens     in patients over 41 years of age),     is one component of     a comprehensive surveillance     program.  Test performance has     been validated by [facility] for patients greater     than or equal to 41 year old.     It is not intended     to diagnose infection nor to     guide or monitor treatment.     Studies: No results found.  Scheduled Meds: . enoxaparin (LOVENOX) injection  40 mg Subcutaneous QHS  . metronidazole  500 mg Intravenous 3 times per day  . nicotine  14 mg Transdermal Daily  . [START ON 03/14/2014] potassium chloride  40 mEq Oral Once  . vancomycin  1,000 mg Intravenous Q8H   Continuous Infusions: . sodium chloride 75 mL/hr at 03/13/14 0040    Principal Problem:   Abscess, gluteal, left Active Problems:   Fever   Sepsis affecting skin   Hypokalemia   Cellulitis of right hand   Cellulitis of right little finger   Abscess of buttock, left    Time spent: 35 minutes.     [facility] LLCKULA,Terrion Gencarelli  [facility] Pager [phone]. If 7PM-7AM, please contact night-coverage at [email or web address], password [email or web address] 03/13/2014, 6:26 PM  LOS: 1 day

## 2014-03-13 NOTE — Progress Notes (Signed)
Right hand swelling and erythema improved from earlier this AM  No surgical intervention needed at this point in time   Please start warm soapy soaks to right hand and small finger TID for 20 minutes

## 2014-03-13 NOTE — Consult Note (Signed)
Reason for Consult:right hand swelling and erythema Referring Physician: dhungel  Joseph Spence is an 43 y.o. male.  HPI:s/p drainage of gluteal abscess on Friday admitted tonight for worsening of gluteal abscess and right hand swelling History reviewed. No pertinent past medical history.  Past Surgical History  Procedure Laterality Date  . Appendectomy    . Hernia repair      History reviewed. No pertinent family history.  Social History:  reports that he has been smoking.  He does not have any smokeless tobacco history on file. He reports that he does not drink alcohol or use illicit drugs.  Allergies: No Known Allergies  Medications:  Scheduled: . ampicillin-sulbactam (UNASYN) IV  3 g Intravenous Q6H  . enoxaparin (LOVENOX) injection  40 mg Subcutaneous QHS  . metronidazole  500 mg Intravenous 3 times per day  . [START ON 03/14/2014] potassium chloride  40 mEq Oral Once  . vancomycin  1,000 mg Intravenous Q8H    Results for orders placed during the hospital encounter of 03/12/14 (from the past 48 hour(s))  CBC WITH DIFFERENTIAL     Status: Abnormal   Collection Time    03/12/14  8:50 PM      Result Value Ref Range   WBC 15.5 (*) 4.0 - 10.5 K/uL   RBC 4.07 (*) 4.22 - 5.81 MIL/uL   Hemoglobin 12.6 (*) 13.0 - 17.0 g/dL   HCT 37.6 (*) 39.0 - 52.0 %   MCV 92.4  78.0 - 100.0 fL   MCH 31.0  26.0 - 34.0 pg   MCHC 33.5  30.0 - 36.0 g/dL   RDW 13.6  11.5 - 15.5 %   Platelets 303  150 - 400 K/uL   Neutrophils Relative % 75  43 - 77 %   Neutro Abs 11.5 (*) 1.7 - 7.7 K/uL   Lymphocytes Relative 16  12 - 46 %   Lymphs Abs 2.4  0.7 - 4.0 K/uL   Monocytes Relative 9  3 - 12 %   Monocytes Absolute 1.5 (*) 0.1 - 1.0 K/uL   Eosinophils Relative 0  0 - 5 %   Eosinophils Absolute 0.0  0.0 - 0.7 K/uL   Basophils Relative 0  0 - 1 %   Basophils Absolute 0.0  0.0 - 0.1 K/uL  COMPREHENSIVE METABOLIC PANEL     Status: Abnormal   Collection Time    03/12/14  8:50 PM      Result Value  Ref Range   Sodium 132 (*) 137 - 147 mEq/L   Potassium 3.3 (*) 3.7 - 5.3 mEq/L   Chloride 96  96 - 112 mEq/L   CO2 23  19 - 32 mEq/L   Glucose, Bld 87  70 - 99 mg/dL   BUN 8  6 - 23 mg/dL   Creatinine, Ser 0.63  0.50 - 1.35 mg/dL   Calcium 8.5  8.4 - 10.5 mg/dL   Total Protein 7.0  6.0 - 8.3 g/dL   Albumin 3.0 (*) 3.5 - 5.2 g/dL   AST 50 (*) 0 - 37 U/L   ALT 69 (*) 0 - 53 U/L   Alkaline Phosphatase 70  39 - 117 U/L   Total Bilirubin 0.5  0.3 - 1.2 mg/dL   GFR calc non Af Amer >90  >90 mL/min   GFR calc Af Amer >90  >90 mL/min   Comment: (NOTE)     The eGFR has been calculated using the CKD EPI equation.  This calculation has not been validated in all clinical situations.     eGFR's persistently <90 mL/min signify possible Chronic Kidney     Disease.  I-STAT CG4 [facility], ED     Status: None   Collection Time    03/12/14 10:59 PM      Result Value Ref Range   Lactic Acid, Venous 0.69  0.5 - 2.2 mmol/L  URINALYSIS, ROUTINE W REFLEX MICROSCOPIC     Status: None   Collection Time    03/13/14 12:00 AM      Result Value Ref Range   Color, Urine YELLOW  YELLOW   APPearance CLEAR  CLEAR   Specific Gravity, Urine 1.010  1.005 - 1.030   pH 6.5  5.0 - 8.0   Glucose, UA NEGATIVE  NEGATIVE mg/dL   Hgb urine dipstick NEGATIVE  NEGATIVE   Bilirubin Urine NEGATIVE  NEGATIVE   Ketones, ur NEGATIVE  NEGATIVE mg/dL   Protein, ur NEGATIVE  NEGATIVE mg/dL   Urobilinogen, UA 1.0  0.0 - 1.0 mg/dL   Nitrite NEGATIVE  NEGATIVE   Leukocytes, UA NEGATIVE  NEGATIVE   Comment: MICROSCOPIC NOT DONE ON URINES WITH NEGATIVE PROTEIN, BLOOD, LEUKOCYTES, NITRITE, OR GLUCOSE <1000 mg/dL.    No results found.  Review of Systems  All other systems reviewed and are negative.  Blood pressure 101/57, pulse 96, temperature 100 F (37.8 C), temperature source Oral, resp. rate 18, height 5' 11"  (1.803 m), weight 68.04 kg (150 lb), SpO2 95.00%. Physical Exam  Constitutional: He is oriented to person,  place, and time. He appears well-developed and well-nourished.  HENT:  Head: Normocephalic and atraumatic.  Cardiovascular: Normal rate.   Respiratory: Effort normal.  Musculoskeletal:       Right hand: He exhibits tenderness and swelling.  Mild right hand erythema and swelling c/w cellulitis  Neurological: He is alert and oriented to person, place, and time.  Skin: Skin is warm. There is erythema.  Psychiatric: He has a normal mood and affect. His behavior is normal. Judgment and thought content normal.    Assessment/Plan: As above   Exam tonight c/w cellulitis  Would start IV ABX and will watch clinically  Repeat WBC at noon and will post for I and D 6/15 in the afternoon if not improved  Ok to eat now but nothing tommorrow  Shanesha Bednarz A 03/13/2014, 12:34 AM

## 2014-03-14 DIAGNOSIS — I369 Nonrheumatic tricuspid valve disorder, unspecified: Secondary | ICD-10-CM

## 2014-03-14 LAB — BASIC METABOLIC PANEL
BUN: 7 mg/dL (ref 6–23)
CO2: 27 mEq/L (ref 19–32)
Calcium: 8.1 mg/dL — ABNORMAL LOW (ref 8.4–10.5)
Chloride: 102 mEq/L (ref 96–112)
Creatinine, Ser: 0.57 mg/dL (ref 0.50–1.35)
Glucose, Bld: 100 mg/dL — ABNORMAL HIGH (ref 70–99)
POTASSIUM: 3.6 meq/L — AB (ref 3.7–5.3)
Sodium: 138 mEq/L (ref 137–147)

## 2014-03-14 LAB — DRUGS OF ABUSE SCREEN W/O ALC, ROUTINE URINE
Amphetamine Screen, Ur: NEGATIVE
Barbiturate Quant, Ur: NEGATIVE
Benzodiazepines.: NEGATIVE
COCAINE METABOLITES: NEGATIVE
CREATININE, U: 96.3 mg/dL
Marijuana Metabolite: POSITIVE — AB
Methadone: NEGATIVE
OPIATE SCREEN, URINE: POSITIVE — AB
PHENCYCLIDINE (PCP): NEGATIVE
Propoxyphene: NEGATIVE

## 2014-03-14 LAB — IRON AND TIBC
Iron: 45 ug/dL (ref 42–135)
SATURATION RATIOS: 20 % (ref 20–55)
TIBC: 226 ug/dL (ref 215–435)
UIBC: 181 ug/dL (ref 125–400)

## 2014-03-14 LAB — HCV RNA QUANT
HCV QUANT LOG: 5.26 {Log} — AB (ref <1.18)
HCV QUANT: 179891 [IU]/mL — AB (ref <15)

## 2014-03-14 LAB — CBC
HCT: 36.6 % — ABNORMAL LOW (ref 39.0–52.0)
Hemoglobin: 12.1 g/dL — ABNORMAL LOW (ref 13.0–17.0)
MCH: 30.8 pg (ref 26.0–34.0)
MCHC: 33.1 g/dL (ref 30.0–36.0)
MCV: 93.1 fL (ref 78.0–100.0)
Platelets: 339 10*3/uL (ref 150–400)
RBC: 3.93 MIL/uL — ABNORMAL LOW (ref 4.22–5.81)
RDW: 13.4 % (ref 11.5–15.5)
WBC: 13.8 10*3/uL — AB (ref 4.0–10.5)

## 2014-03-14 LAB — VANCOMYCIN, TROUGH: VANCOMYCIN TR: 9.3 ug/mL — AB (ref 10.0–20.0)

## 2014-03-14 LAB — RETICULOCYTES
RBC.: 3.94 MIL/uL — AB (ref 4.22–5.81)
RETIC CT PCT: 0.9 % (ref 0.4–3.1)
Retic Count, Absolute: 35.5 10*3/uL (ref 19.0–186.0)

## 2014-03-14 MED ORDER — VANCOMYCIN HCL 10 G IV SOLR
1250.0000 mg | Freq: Three times a day (TID) | INTRAVENOUS | Status: DC
  Administered 2014-03-14 – 2014-03-16 (×7): 1250 mg via INTRAVENOUS
  Filled 2014-03-14 (×7): qty 1250

## 2014-03-14 MED ORDER — VANCOMYCIN HCL 10 G IV SOLR
1250.0000 mg | Freq: Three times a day (TID) | INTRAVENOUS | Status: DC
  Filled 2014-03-14: qty 1250

## 2014-03-14 MED ORDER — COLLAGENASE 250 UNIT/GM EX OINT
TOPICAL_OINTMENT | Freq: Every day | CUTANEOUS | Status: DC
  Administered 2014-03-14 – 2014-03-15 (×2): via TOPICAL
  Administered 2014-03-16 – 2014-03-17 (×2): 1 via TOPICAL
  Administered 2014-03-18 – 2014-03-19 (×2): via TOPICAL
  Filled 2014-03-14: qty 30

## 2014-03-14 NOTE — Progress Notes (Signed)
ANTIBIOTIC CONSULT NOTE   Pharmacy Consult for Vancomycin/Zosyn Indication: Left Gluteal abscess with sepsis/ R hand cellulitis/abscess    No Known Allergies  Patient Measurements: Height: 5\' 11"  (180.3 cm) Weight: 150 lb (68.04 kg) IBW/kg (Calculated) : 75.3   Vital Signs: Temp: 98.6 F (37 C) (06/16 0503) Temp src: Oral (06/16 0503) BP: 112/64 mmHg (06/16 0503) Pulse Rate: 72 (06/16 0503) Intake/Output from previous day: 06/15 0701 - 06/16 0700 In: 2700 [I.V.:2200; IV Piggyback:500] Out: 700 [Urine:700] Intake/Output from this shift:    Labs:  Recent Labs  03/12/14 2050 03/13/14 0505 03/13/14 1230 03/14/14 0450  WBC 15.5*  --  13.7* 13.8*  HGB 12.6*  --  12.4* 12.1*  PLT 303  --  297 339  CREATININE 0.63 0.72  --  0.57   Estimated Creatinine Clearance: 116.9 ml/min (by C-G formula based on Cr of 0.57).  Recent Labs  03/14/14 0700  VANCOTROUGH 9.3*     Microbiology: Recent Results (from the past 720 hour(s))  CULTURE, BLOOD (ROUTINE X 2)     Status: None   Collection Time    03/12/14  8:50 PM      Result Value Ref Range Status   Specimen Description BLOOD RIGHT ANTECUBITAL   Final   Special Requests BOTTLES DRAWN AEROBIC AND ANAEROBIC 5CC   Final   Culture  Setup Time     Final   Value: 03/13/2014 00:45     Performed at [facility]   Culture     Final   Value:        BLOOD CULTURE RECEIVED NO GROWTH TO DATE CULTURE WILL BE HELD FOR 5 DAYS BEFORE ISSUING A FINAL NEGATIVE REPORT     Performed at [facility]   Report Status PENDING   Incomplete  CULTURE, BLOOD (ROUTINE X 2)     Status: None   Collection Time    03/12/14  8:50 PM      Result Value Ref Range Status   Specimen Description BLOOD LEFT ANTECUBITAL   Final   Special Requests BOTTLES DRAWN AEROBIC AND ANAEROBIC 5CC   Final   Culture  Setup Time     Final   Value: 03/13/2014 00:45     Performed at [facility]   Culture     Final   Value:        BLOOD CULTURE  RECEIVED NO GROWTH TO DATE CULTURE WILL BE HELD FOR 5 DAYS BEFORE ISSUING A FINAL NEGATIVE REPORT     Performed at [facility]   Report Status PENDING   Incomplete  CULTURE, ROUTINE-ABSCESS     Status: None   Collection Time    03/12/14 10:03 PM      Result Value Ref Range Status   Specimen Description WOUND BUTTOCKS   Final   Special Requests NONE   Final   Gram Stain     Final   Value: MODERATE WBC PRESENT, PREDOMINANTLY PMN     RARE SQUAMOUS EPITHELIAL CELLS PRESENT     MODERATE GRAM POSITIVE COCCI IN PAIRS     IN CLUSTERS     Performed at [facility]   Culture     Final   Value: MODERATE STAPHYLOCOCCUS AUREUS     Note: RIFAMPIN AND GENTAMICIN SHOULD NOT BE USED AS SINGLE DRUGS FOR TREATMENT OF STAPH INFECTIONS.     Performed at [facility]   Report Status PENDING   Incomplete  GC/CHLAMYDIA PROBE AMP  Status: None   Collection Time    03/13/14 12:03 AM      Result Value Ref Range Status   CT Probe RNA NEGATIVE  NEGATIVE Final   GC Probe RNA NEGATIVE  NEGATIVE Final   Comment: (NOTE)                                                                                               **Normal Reference Range: Negative**          Assay performed using the Gen-Probe APTIMA COMBO2 (R) Assay.     Acceptable specimen types for this assay include APTIMA Swabs (Unisex,     endocervical, urethral, or vaginal), first void urine, and ThinPrep     liquid based cytology samples.     Performed at [facility]  SURGICAL PCR SCREEN     Status: Abnormal   Collection Time    03/13/14  9:56 AM      Result Value Ref Range Status   MRSA, PCR POSITIVE (*) NEGATIVE Final   Comment: RESULT CALLED TO, READ BACK BY AND VERIFIED WITH:     RODRIGUEZ,A @ 1141 ON [id number] BY POTEAT,S   Staphylococcus aureus POSITIVE (*) NEGATIVE Final   Comment:            The Xpert SA Assay (FDA     approved for NASAL specimens     in patients over 41 years of age),     is one  component of     a comprehensive surveillance     program.  Test performance has     been validated by [facility] for patients greater     than or equal to 117 year old.     It is not intended     to diagnose infection nor to     guide or monitor treatment.    Medical History: History reviewed. No pertinent past medical history.  Medications:  Scheduled:  . enoxaparin (LOVENOX) injection  40 mg Subcutaneous QHS  . metronidazole  500 mg Intravenous 3 times per day  . nicotine  14 mg Transdermal Daily  . piperacillin-tazobactam (ZOSYN)  IV  3.375 g Intravenous Q8H  . potassium chloride  40 mEq Oral Once  . vancomycin  1,250 mg Intravenous Q8H   Infusions:  . 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  Concern noted for possible staph bacteremia. Vancomycin and Zosyn were started with pharmacy dosing assistance requested.   MD also added Flagyl IV.  Goal of therapy: Appropriate antibiotic dosing for renal function; eradication of infection. Vancomycin trough 15-20 until bacteremia ruled out  6/16:  D#2 Vancomycin 1000 mg IV q8h / Zosyn 3.375 grams IV q8h (extended-infusion) / Flagyl 500 mg IV q8h [per MD])  Staph aureus growth in buttock abscess culture noted; susceptibilities are pending  Blood culture and TTE results pending  Serum creatinine stable  Vancomycin trough subtherapeutic  Plan: 1. Increase vancomycin to 1250 mg IV q8h for now (next dose 1800). 2. Recheck vancomycin trough at steady-state unless de-escalation allows discontinuation. 3. Continue Zosyn 3.375 grams IV q8h (extended-infusion). 4. Await susceptibilities of S.aureus isolate from buttock abscess. 5. Follow blood cultures. 6. Await TTE  results. 7. Follow serum creatinine, clinical course. 8. Suggest considering discontinuation of Flagyl (Zosyn already provides anaerobic coverage).  Elie Goodyandy Absher, PharmD, BCPS Pager: [phone] 03/14/2014  10:10 AM

## 2014-03-14 NOTE — Progress Notes (Signed)
Patient ID: Joseph Spence, male   DOB: [birth year], 43 y.o.   MRN: [id number] 1 Day Post-Op  Subjective: Pt feels ok today.  Says buttock hurts less today.  Hand started draining some last night with warm soaks  Objective: Vital signs in last 24 hours: Temp:  [98.4 F (36.9 C)-98.6 F (37 C)] 98.6 F (37 C) (06/16 0503) Pulse Rate:  [72-77] 72 (06/16 0503) Resp:  [16-18] 16 (06/16 0503) BP: (108-116)/(64-69) 112/64 mmHg (06/16 0503) SpO2:  [94 %-97 %] 96 % (06/16 0503) Last BM Date: 03/13/14  Intake/Output from previous day: 06/15 0701 - 06/16 0700 In: 2700 [I.V.:2200; IV Piggyback:500] Out: 700 [Urine:700] Intake/Output this shift: Total I/O In: -  Out: 200 [Urine:200]  PE: Skin: buttock wound open, 100% fibrin tissue.  Still with some erythema and induration, but no purulent drainage currently.  Left thigh with area of erythema.  Small opening with minimal purulent drainage  Lab Results:   Recent Labs  03/13/14 1230 03/14/14 0450  WBC 13.7* 13.8*  HGB 12.4* 12.1*  HCT 36.1* 36.6*  PLT 297 339   BMET  Recent Labs  03/13/14 0505 03/14/14 0450  NA 140 138  K 3.5* 3.6*  CL 101 102  CO2 27 27  GLUCOSE 100* 100*  BUN 9 7  CREATININE 0.72 0.57  CALCIUM 8.4 8.1*   PT/INR No results found for this basename: LABPROT, INR,  in the last 72 hours CMP     Component Value Date/Time   NA 138 03/14/2014 0450   K 3.6* 03/14/2014 0450   CL 102 03/14/2014 0450   CO2 27 03/14/2014 0450   GLUCOSE 100* 03/14/2014 0450   BUN 7 03/14/2014 0450   CREATININE 0.57 03/14/2014 0450   CALCIUM 8.1* 03/14/2014 0450   PROT 7.0 03/12/2014 2050   ALBUMIN 3.0* 03/12/2014 2050   AST 50* 03/12/2014 2050   ALT 69* 03/12/2014 2050   ALKPHOS 70 03/12/2014 2050   BILITOT 0.5 03/12/2014 2050   GFRNONAA >90 03/14/2014 0450   GFRAA >90 03/14/2014 0450   Lipase  No results found for this basename: lipase       Studies/Results: No results found.  Anti-infectives: Anti-infectives   Start      Dose/Rate Route Frequency Ordered Stop   03/14/14 1800  vancomycin (VANCOCIN) 1,250 mg in sodium chloride 0.9 % 250 mL IVPB     1,250 mg 166.7 mL/hr over 90 Minutes Intravenous Every 8 hours 03/14/14 0942     03/14/14 1000  vancomycin (VANCOCIN) 1,250 mg in sodium chloride 0.9 % 250 mL IVPB  Status:  Discontinued     1,250 mg 166.7 mL/hr over 90 Minutes Intravenous Every 8 hours 03/14/14 0907 03/14/14 0942   03/13/14 2000  piperacillin-tazobactam (ZOSYN) IVPB 3.375 g     3.375 g 12.5 mL/hr over 240 Minutes Intravenous Every 8 hours 03/13/14 1835     03/13/14 0045  metroNIDAZOLE (FLAGYL) IVPB 500 mg     500 mg 100 mL/hr over 60 Minutes Intravenous 3 times per day 03/13/14 0030     03/13/14 0000  vancomycin (VANCOCIN) IVPB 1000 mg/200 mL premix  Status:  Discontinued     1,000 mg 200 mL/hr over 60 Minutes Intravenous Every 8 hours 03/12/14 2351 03/14/14 0906   03/13/14 0000  Ampicillin-Sulbactam (UNASYN) 3 g in sodium chloride 0.9 % 100 mL IVPB  Status:  Discontinued     3 g 100 mL/hr over 60 Minutes Intravenous Every 6 hours 03/12/14 2354 03/13/14 1825  03/12/14 2045  clindamycin (CLEOCIN) IVPB 600 mg     600 mg 100 mL/hr over 30 Minutes Intravenous  Once 03/12/14 2032 03/12/14 2200       Assessment/Plan  1. MRSA 2. Buttock wound and multiple lower and upper extremity abscesses  Plan: 1. Await blood cultures.  Query bacteremia given multiple sites of infection or need for ECHO to rule out endocarditis. 2. Add hydrotherapy for patient's buttock wound to clean it up along with santyl.   LOS: 2 days    Joseph Spence 03/14/2014, 11:33 AM Pager: [phone]

## 2014-03-14 NOTE — Progress Notes (Signed)
  Echocardiogram 2D Echocardiogram has been performed.  Cathie BeamsGREGORY, Jamiyla Ishee 03/14/2014, 3:26 PM

## 2014-03-14 NOTE — Progress Notes (Signed)
03/14/14 1603  Subjective Assessment  Subjective I am glad you are here, it hurts a little but tolerable  Patient and Family Stated Goals to get this thing better  Date of Onset 03/07/14  Evaluation and Treatment  Evaluation and Treatment Procedures Explained to Patient/Family Yes  Evaluation and Treatment Procedures agreed to  Wound / Incision (Open or Dehisced) 03/14/14 Other (Comment) Buttocks Left HYDRO Pulsed Lavage  Date First Assessed/Time First Assessed: 03/14/14 1530   Wound Type: Other (Comment)  Location: Buttocks  Location Orientation: Left  Wound Description (Comments): HYDRO Pulsed Lavage  Present on Admission: Yes  Dressing Type Moist to dry (snatyle, adn 4x4 packing and to cover)  Dressing Changed Changed  Dressing Status Clean  Dressing Change Frequency Twice a day  Site / Wound Assessment Yellow (fbrin/slough)  % Wound base Red or Granulating 30%  % Wound base Yellow 70%  Peri-wound Assessment Induration;Maceration;Pink;Purple (indurated about 2 centimeters around wound)  Wound Length (cm) 3 cm  Wound Width (cm) 2 cm  Wound Depth (cm) 1 cm  Margins Unattached edges (unapproximated)  Closure None  Drainage Amount Minimal  Drainage Description Serosanguineous  Non-staged Wound Description Full thickness  Treatment Debridement (Selective);Hydrotherapy (Pulse lavage)  Hydrotherapy  Pulsed Lavage with Suction (psi) 8 psi (up to 12 at times to pt tolerance)  Pulsed Lavage with Suction - Normal Saline Used 1000 mL  Pulsed Lavage Tip Tip with splash shield  Pulsed lavage therapy - wound location L butttocks   Selective Debridement  Selective Debridement - Location l buttocks   Selective Debridement - Tools Used Forceps;Scissors  Selective Debridement - Tissue Removed white fibrin slough (small amounts do to tender with such good blood flow to area)  Wound Therapy - Assess/Plan/Recommendations  Wound Therapy - Clinical Statement Pt to benefit from pulsed lavage to  continue to decrease necrotic tissue   Wound Therapy - Functional Problem List Pt with necrotic areas of left buttocks wound  Hydrotherapy Plan Dressing change;Debridement;Pulsatile lavage with suction;Patient/family education  Wound Therapy - Frequency 6X / week (with nursing to do all other dressing changes on other days )  Wound Therapy - Follow Up Recommendations Wound Care Center  Wound Plan Continue whil here in acute care or until no longer needed to help remove necrotic tissue  Wound Therapy Goals - Improve the function of patient's integumentary system by progressing the wound(s) through the phases of wound healing by:  Decrease Necrotic Tissue to less than 10%  Decrease Necrotic Tissue - Progress Goal set today  Increase Granulation Tissue to 90% or greater  Increase Granulation Tissue - Progress Goal set today  Improve Drainage Characteristics Min  Improve Drainage Characteristics - Progress Goal set today

## 2014-03-14 NOTE — Progress Notes (Signed)
[facility] PROGRESS NOTE  Joseph Spence WUJ:[id number] DOB: [birth year] DOA: 03/12/2014 PCP: No primary provider on file. 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 He was discharged home on Keflex and Bactrim. Patient reports that 9 days back he had gone camping and shortly after returning from there he noted some erythema and small pustules over the left gluteal area. 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 For past 2 days he also noticed it edema and swelling over his right hand that started over the little finger and extended over the dorsum of his hand. He was unable to flex his fingers and the site became more painful. He reports seeing pustules for the ast 2 days. In ED, he was febrile and he underwent blood cultures and I&D of the left gluteal abscess. He was started on IV antibiotics. Surgery and orthopedics consulted. Dr Mina MarbleWeingold suggested no need for I&d of the right hand finger. Surgery recommended continueing the antibiotics and hydrotherapy. Culture reports from the wound show moderate staph aureus and he is on IV vancomycin and IV zosyn. Blood cultures are negative so far. Echocardiogram ordered in view of the involvement of the multiple skin areas .    Assessment/Plan:  Left gluteal abscess with sepsis: given disseminated areas of pustules and involvement of multiple skin areas with abscesses involving buttock and right hand with hx of IV drug use, staph bacteremia is of concern , echocardiogram ordered as he reports IV drug use 3 months ago. Follow blood cultures.  -Left gluteal abscess drained by ED physician and cultures sent. Blood cultures sent from the ED.  Patient received a dose of IV clindamycin.he is currently on  IV vancomycin and IV zosyn with IV flagyl. HIV ab negative. HCV  Antibody positive.  Surgery consulted , recommended to continue with IV antibiotics and hydrotherapy. ? Need for debridement in am.    Cellulitis with possible abscess over right hand and little finger  Given swelling and fluctuation and severe tenderness over the area, appears to have underlying abscess as well.  hand surgery consult Dr Mina MarbleWeingold  will see the patient.  No surgical intervention. Warm soaks.   Hypokalemia:  Will be repleted as needed.   Tobacco abuse - nicotine patch ordered.   Drug abuse - counseling given.   Leukocytosis: Secondary to the sepsis.    Anemia- normocytic. Anemia panel will be obtained.    Hepatitis C antibody positive.  Elevated liver function tests - h/o drug abuse, hcv RNA quan ordered.   DVT prophylaxis.         Code Status: full code Family Communication:family at bedside.  Disposition Plan remain inpatient.    Consultants:  Surgery  Hand surgery- orthpedics.   Procedures:  I&D on admission.   Antibiotics:  Vancomycin6 /15  Zosyn 6/15  unasyn 6/14 till 6/15  Clindamycin 6/14 one dose  Flagyl 6/15  HPI/Subjective: Wants to know when he can go home. Also wants to know when he is getting an I&D  Objective: Filed Vitals:   03/14/14 1400  BP: 103/62  Pulse: 65  Temp: 98.5 F (36.9 C)  Resp: 18    Intake/Output Summary (Last  24 hours) at 03/14/14 1531 Last data filed at 03/14/14 1400  Gross per 24 hour  Intake   2700 ml  Output    900 ml  Net   1800 ml   Filed Weights   03/12/14 1952  Weight: 68.04 kg (150 lb)    Exam:   General:  Alert comfortable,   Cardiovascular: s1s2  Respiratory: ctab  Abdomen: soft NT ND BS+ Musculoskeletal: Multiple small 5 mm pustules spreading diffusely to the left lower thighs and leg. Swelling of the right hand a week fluctuation over the  dorsum of the right hand with a small pus point without any discharge. Swelling of the right little finger with a small pus point as well . Warm and very tender to palpation. Tender to pressure over right of this joint. Patient unable to fully flex his fingers . No needle marks seen .      Data Reviewed: Basic Metabolic Panel:  Recent Labs Lab 03/12/14 2050 03/13/14 0505 03/14/14 0450  NA 132* 140 138  K 3.3* 3.5* 3.6*  CL 96 101 102  CO2 23 27 27   GLUCOSE 87 100* 100*  BUN 8 9 7   CREATININE 0.63 0.72 0.57  CALCIUM 8.5 8.4 8.1*   Liver Function Tests:  Recent Labs Lab 03/12/14 2050  AST 50*  ALT 69*  ALKPHOS 70  BILITOT 0.5  PROT 7.0  ALBUMIN 3.0*   No results found for this basename: LIPASE, AMYLASE,  in the last 168 hours No results found for this basename: AMMONIA,  in the last 168 hours CBC:  Recent Labs Lab 03/12/14 2050 03/13/14 1230 03/14/14 0450  WBC 15.5* 13.7* 13.8*  NEUTROABS 11.5*  --   --   HGB 12.6* 12.4* 12.1*  HCT 37.6* 36.1* 36.6*  MCV 92.4 91.2 93.1  PLT 303 297 339   Cardiac Enzymes: No results found for this basename: CKTOTAL, CKMB, CKMBINDEX, TROPONINI,  in the last 168 hours BNP (last 3 results) No results found for this basename: PROBNP,  in the last 8760 hours CBG: No results found for this basename: GLUCAP,  in the last 168 hours  Recent Results (from the past 240 hour(s))  CULTURE, BLOOD (ROUTINE X 2)     Status: None   Collection Time    03/12/14  8:50 PM      Result Value Ref Range Status   Specimen Description BLOOD RIGHT ANTECUBITAL   Final   Special Requests BOTTLES DRAWN AEROBIC AND ANAEROBIC 5CC   Final   Culture  Setup Time     Final   Value: 03/13/2014 00:45     Performed at [facility]   Culture     Final   Value:        BLOOD CULTURE RECEIVED NO GROWTH TO DATE CULTURE WILL BE HELD FOR 5 DAYS BEFORE ISSUING A FINAL NEGATIVE REPORT     Performed at [facility]   Report Status PENDING    Incomplete  CULTURE, BLOOD (ROUTINE X 2)     Status: None   Collection Time    03/12/14  8:50 PM      Result Value Ref Range Status   Specimen Description BLOOD LEFT ANTECUBITAL   Final   Special Requests BOTTLES DRAWN AEROBIC AND ANAEROBIC 5CC   Final   Culture  Setup Time     Final   Value: 03/13/2014 00:45     Performed at [facility]   Culture     Final  Value:        BLOOD CULTURE RECEIVED NO GROWTH TO DATE CULTURE WILL BE HELD FOR 5 DAYS BEFORE ISSUING A FINAL NEGATIVE REPORT     Performed at [facility]   Report Status PENDING   Incomplete  CULTURE, ROUTINE-ABSCESS     Status: None   Collection Time    03/12/14 10:03 PM      Result Value Ref Range Status   Specimen Description WOUND BUTTOCKS   Final   Special Requests NONE   Final   Gram Stain     Final   Value: MODERATE WBC PRESENT, PREDOMINANTLY PMN     RARE SQUAMOUS EPITHELIAL CELLS PRESENT     MODERATE GRAM POSITIVE COCCI IN PAIRS     IN CLUSTERS     Performed at [facility]   Culture     Final   Value: MODERATE STAPHYLOCOCCUS AUREUS     Note: RIFAMPIN AND GENTAMICIN SHOULD NOT BE USED AS SINGLE DRUGS FOR TREATMENT OF STAPH INFECTIONS.     Performed at [facility]   Report Status PENDING   Incomplete  GC/CHLAMYDIA PROBE AMP     Status: None   Collection Time    03/13/14 12:03 AM      Result Value Ref Range Status   CT Probe RNA NEGATIVE  NEGATIVE Final   GC Probe RNA NEGATIVE  NEGATIVE Final   Comment: (NOTE)                                                                                               **Normal Reference Range: Negative**          Assay performed using the Gen-Probe APTIMA COMBO2 (R) Assay.     Acceptable specimen types for this assay include APTIMA Swabs (Unisex,     endocervical, urethral, or vaginal), first void urine, and ThinPrep     liquid based cytology samples.     Performed at [facility]  SURGICAL PCR SCREEN     Status: Abnormal    Collection Time    03/13/14  9:56 AM      Result Value Ref Range Status   MRSA, PCR POSITIVE (*) NEGATIVE Final   Comment: RESULT CALLED TO, READ BACK BY AND VERIFIED WITH:     RODRIGUEZ,A @ 1141 ON [id number] BY POTEAT,S   Staphylococcus aureus POSITIVE (*) NEGATIVE Final   Comment:            The Xpert SA Assay (FDA     approved for NASAL specimens     in patients over 41 years of age),     is one component of     a comprehensive surveillance     program.  Test performance has     been validated by [facility] for patients greater     than or equal to 41 year old.     It is not intended     to diagnose infection nor to     guide or monitor treatment.     Studies: No results found.  Scheduled  Meds: . collagenase   Topical Daily  . enoxaparin (LOVENOX) injection  40 mg Subcutaneous QHS  . metronidazole  500 mg Intravenous 3 times per day  . nicotine  14 mg Transdermal Daily  . piperacillin-tazobactam (ZOSYN)  IV  3.375 g Intravenous Q8H  . potassium chloride  40 mEq Oral Once  . vancomycin  1,250 mg Intravenous Q8H   Continuous Infusions: . sodium chloride 75 mL/hr at 03/14/14 0345    Principal Problem:   Abscess, gluteal, left Active Problems:   Fever   Sepsis affecting skin   Hypokalemia   Cellulitis of right hand   Cellulitis of right little finger   Abscess of buttock, left    Time spent: 35 minutes.     [facility]  [facility] Pager [phone]. If 7PM-7AM, please contact night-coverage at [email or web address], password [facility] 03/14/2014, 3:31 PM  LOS: 2 days

## 2014-03-15 DIAGNOSIS — R509 Fever, unspecified: Secondary | ICD-10-CM

## 2014-03-15 DIAGNOSIS — Z1611 Resistance to penicillins: Secondary | ICD-10-CM

## 2014-03-15 LAB — CULTURE, ROUTINE-ABSCESS

## 2014-03-15 LAB — FERRITIN: FERRITIN: 180 ng/mL (ref 22–322)

## 2014-03-15 LAB — FOLATE: Folate: 11.2 ng/mL

## 2014-03-15 LAB — VITAMIN B12: Vitamin B-12: 488 pg/mL (ref 211–911)

## 2014-03-15 NOTE — Progress Notes (Signed)
[facility] PROGRESS NOTE  Joseph Spence:[id number] DOB: [birth year] DOA: 03/12/2014 PCP: No primary provider on file. 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 He was discharged home on Keflex and Bactrim. Patient reports that 9 days back he had gone camping and shortly after returning from there he noted some erythema and small pustules over the left gluteal area. 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 For past 2 days he also noticed it edema and swelling over his right hand that started over the little finger and extended over the dorsum of his hand. He was unable to flex his fingers and the site became more painful. He reports seeing pustules for the ast 2 days. In ED, he was febrile and he underwent blood cultures and I&D of the left gluteal abscess. He was started on IV antibiotics. Surgery and orthopedics consulted. Dr Mina Marble suggested no need for I&d of the right hand finger. Surgery recommended continueing the antibiotics and hydrotherapy. Culture reports from the wound show moderate staph aureus and he is on IV vancomycin and IV zosyn. Blood cultures are negative so far. Echocardiogram ordered in view of the involvement of the multiple skin areas .    Assessment/Plan:  Left gluteal abscess with sepsis: given disseminated areas of pustules and involvement of multiple skin areas with abscesses involving buttock and right hand with hx of IV drug use, staph bacteremia is of concern , echocardiogram ordered as he reports IV drug use 3 months ago. Follow blood cultures.  -General surgery following -Wound Culture growing MRSA, organism susceptible to Vancomycin. Blood  cultures from 03/12/2014 are negative to date    Cellulitis with possible abscess over right hand and little finger  Given swelling and fluctuation and severe tenderness over the area.  Hand surgery, Dr Mina Marble consulted.  Hypokalemia:  Will be repleted as needed.   Tobacco abuse - nicotine patch ordered.   Drug abuse - counseling given.   Leukocytosis: Secondary to the sepsis.    Anemia- normocytic. Anemia panel will be obtained.    Hepatitis C antibody positive.  Elevated liver function tests - h/o drug abuse  DVT prophylaxis.         Code Status: full code Family Communication:family at bedside.  Disposition Plan remain inpatient.    Consultants:  Surgery  Hand surgery- orthpedics.   Procedures:  I&D on admission.   Antibiotics:  Vancomycin6 /15  Zosyn 6/15  unasyn 6/14 till 6/15  Clindamycin 6/14 one dose  Flagyl 6/15  HPI/Subjective: He reports improvement to hand,   Objective: Filed Vitals:   03/15/14 0535  BP: 122/66  Pulse: 103  Temp: 98 F (36.7 C)  Resp: 16    Intake/Output Summary (Last 24 hours) at 03/15/14 1106 Last data filed at 03/15/14 1000  Gross per 24 hour  Intake   1840 ml  Output   1400 ml  Net    440 ml   Filed Weights   03/12/14 1952  Weight: 68.04 kg (150 lb)    Exam:   General:  Alert comfortable,   Cardiovascular: s1s2  Respiratory: ctab  Abdomen: soft NT ND BS+ Musculoskeletal: Multiple small 5 mm  pustules spreading diffusely to the left lower thighs and leg. Swelling of the right hand a week fluctuation over the dorsum of the right hand with a small pus point without any discharge. Swelling of the right little finger with a small pus point as well . Warm and very tender to palpation. Tender to pressure over right of this joint. Patient unable to fully flex his fingers . No needle marks seen .      Data Reviewed: Basic Metabolic Panel:  Recent Labs Lab 03/12/14 2050 03/13/14 0505  03/14/14 0450  NA 132* 140 138  K 3.3* 3.5* 3.6*  CL 96 101 102  CO2 23 27 27   GLUCOSE 87 100* 100*  BUN 8 9 7   CREATININE 0.63 0.72 0.57  CALCIUM 8.5 8.4 8.1*   Liver Function Tests:  Recent Labs Lab 03/12/14 2050  AST 50*  ALT 69*  ALKPHOS 70  BILITOT 0.5  PROT 7.0  ALBUMIN 3.0*   No results found for this basename: LIPASE, AMYLASE,  in the last 168 hours No results found for this basename: AMMONIA,  in the last 168 hours CBC:  Recent Labs Lab 03/12/14 2050 03/13/14 1230 03/14/14 0450  WBC 15.5* 13.7* 13.8*  NEUTROABS 11.5*  --   --   HGB 12.6* 12.4* 12.1*  HCT 37.6* 36.1* 36.6*  MCV 92.4 91.2 93.1  PLT 303 297 339   Cardiac Enzymes: No results found for this basename: CKTOTAL, CKMB, CKMBINDEX, TROPONINI,  in the last 168 hours BNP (last 3 results) No results found for this basename: PROBNP,  in the last 8760 hours CBG: No results found for this basename: GLUCAP,  in the last 168 hours  Recent Results (from the past 240 hour(s))  CULTURE, BLOOD (ROUTINE X 2)     Status: None   Collection Time    03/12/14  8:50 PM      Result Value Ref Range Status   Specimen Description BLOOD RIGHT ANTECUBITAL   Final   Special Requests BOTTLES DRAWN AEROBIC AND ANAEROBIC 5CC   Final   Culture  Setup Time     Final   Value: 03/13/2014 00:45     Performed at [facility]   Culture     Final   Value:        BLOOD CULTURE RECEIVED NO GROWTH TO DATE CULTURE WILL BE HELD FOR 5 DAYS BEFORE ISSUING A FINAL NEGATIVE REPORT     Performed at [facility]   Report Status PENDING   Incomplete  CULTURE, BLOOD (ROUTINE X 2)     Status: None   Collection Time    03/12/14  8:50 PM      Result Value Ref Range Status   Specimen Description BLOOD LEFT ANTECUBITAL   Final   Special Requests BOTTLES DRAWN AEROBIC AND ANAEROBIC 5CC   Final   Culture  Setup Time     Final   Value: 03/13/2014 00:45     Performed at [facility]   Culture     Final   Value:         BLOOD CULTURE RECEIVED NO GROWTH TO DATE CULTURE WILL BE HELD FOR 5 DAYS BEFORE ISSUING A FINAL NEGATIVE REPORT     Performed at [facility]   Report Status PENDING   Incomplete  CULTURE, ROUTINE-ABSCESS     Status: None   Collection Time    03/12/14 10:03 PM      Result Value Ref Range Status  Specimen Description WOUND BUTTOCKS   Final   Special Requests NONE   Final   Gram Stain     Final   Value: MODERATE WBC PRESENT, PREDOMINANTLY PMN     RARE SQUAMOUS EPITHELIAL CELLS PRESENT     MODERATE GRAM POSITIVE COCCI IN PAIRS     IN CLUSTERS     Performed at [facility]   Culture     Final   Value: MODERATE METHICILLIN RESISTANT STAPHYLOCOCCUS AUREUS     Note: RIFAMPIN AND GENTAMICIN SHOULD NOT BE USED AS SINGLE DRUGS FOR TREATMENT OF STAPH INFECTIONS. This organism DOES NOT demonstrate inducible Clindamycin resistance in vitro.     Performed at [facility]   Report Status 03/15/2014 FINAL   Final   Organism ID, Bacteria METHICILLIN RESISTANT STAPHYLOCOCCUS AUREUS   Final  GC/CHLAMYDIA PROBE AMP     Status: None   Collection Time    03/13/14 12:03 AM      Result Value Ref Range Status   CT Probe RNA NEGATIVE  NEGATIVE Final   GC Probe RNA NEGATIVE  NEGATIVE Final   Comment: (NOTE)                                                                                               **Normal Reference Range: Negative**          Assay performed using the Gen-Probe APTIMA COMBO2 (R) Assay.     Acceptable specimen types for this assay include APTIMA Swabs (Unisex,     endocervical, urethral, or vaginal), first void urine, and ThinPrep     liquid based cytology samples.     Performed at [facility]  SURGICAL PCR SCREEN     Status: Abnormal   Collection Time    03/13/14  9:56 AM      Result Value Ref Range Status   MRSA, PCR POSITIVE (*) NEGATIVE Final   Comment: RESULT CALLED TO, READ BACK BY AND VERIFIED WITH:     RODRIGUEZ,A @ 1141 ON [id number] BY POTEAT,S    Staphylococcus aureus POSITIVE (*) NEGATIVE Final   Comment:            The Xpert SA Assay (FDA     approved for NASAL specimens     in patients over 41 years of age),     is one component of     a comprehensive surveillance     program.  Test performance has     been validated by [facility] for patients greater     than or equal to 41 year old.     It is not intended     to diagnose infection nor to     guide or monitor treatment.     Studies: No results found.  Scheduled Meds: . collagenase   Topical Daily  . enoxaparin (LOVENOX) injection  40 mg Subcutaneous QHS  . metronidazole  500 mg Intravenous 3 times per day  . nicotine  14 mg Transdermal Daily  . piperacillin-tazobactam (ZOSYN)  IV  3.375 g Intravenous Q8H  . potassium chloride  40 mEq Oral Once  . vancomycin  1,250 mg Intravenous Q8H   Continuous Infusions: . sodium chloride 75 mL/hr at 03/14/14 0345    Principal Problem:   Abscess, gluteal, left Active Problems:   Fever   Sepsis affecting skin   Hypokalemia   Cellulitis of right hand   Cellulitis of right little finger   Abscess of buttock, left    Time spent: 25 minutes.     Jeralyn Bennett  [facility] Pager [phone]. If 7PM-7AM, please contact night-coverage at [email or web address], password [facility] 03/15/2014, 11:06 AM  LOS: 3 days

## 2014-03-15 NOTE — Progress Notes (Signed)
[facility] Progress Note  2 Days Post-Op  Subjective: Pt says buttock is sore, left thigh hasn't drained any.  Says he saw Dr. Mina MarbleWeingold last night who told him he was going to treat his hand conservatively with IV antibiotics.  He is currently NPO at the request of Dr. Mina MarbleWeingold.  Right hand supposedly drained a lot last night.   Objective: Vital signs in last 24 hours: Temp:  [98 F (36.7 C)-98.6 F (37 C)] 98 F (36.7 C) (06/17 0535) Pulse Rate:  [65-103] 103 (06/17 0535) Resp:  [16-18] 16 (06/17 0535) BP: (103-134)/(62-85) 122/66 mmHg (06/17 0535) SpO2:  [95 %-98 %] 95 % (06/17 0535) Last BM Date: 03/13/14  Intake/Output from previous day: 06/16 0701 - 06/17 0700 In: 2040 [P.O.:540; I.V.:900; IV Piggyback:600] Out: 1600 [Urine:1600] Intake/Output this shift:    PE: Gen:  Alert, NAD, pleasant Skin: buttock wound open, 90% fibrin tissue. Still with some erythema and induration, but no purulent drainage currently. Left thigh with area of erythema. Small opening with minimal purulent drainage   Lab Results:   Recent Labs  03/13/14 1230 03/14/14 0450  WBC 13.7* 13.8*  HGB 12.4* 12.1*  HCT 36.1* 36.6*  PLT 297 339   BMET  Recent Labs  03/13/14 0505 03/14/14 0450  NA 140 138  K 3.5* 3.6*  CL 101 102  CO2 27 27  GLUCOSE 100* 100*  BUN 9 7  CREATININE 0.72 0.57  CALCIUM 8.4 8.1*   PT/INR No results found for this basename: LABPROT, INR,  in the last 72 hours CMP     Component Value Date/Time   NA 138 03/14/2014 0450   K 3.6* 03/14/2014 0450   CL 102 03/14/2014 0450   CO2 27 03/14/2014 0450   GLUCOSE 100* 03/14/2014 0450   BUN 7 03/14/2014 0450   CREATININE 0.57 03/14/2014 0450   CALCIUM 8.1* 03/14/2014 0450   PROT 7.0 03/12/2014 2050   ALBUMIN 3.0* 03/12/2014 2050   AST 50* 03/12/2014 2050   ALT 69* 03/12/2014 2050   ALKPHOS 70 03/12/2014 2050   BILITOT 0.5 03/12/2014 2050   GFRNONAA >90 03/14/2014 0450   GFRAA >90 03/14/2014 0450   Lipase  No  results found for this basename: lipase       Studies/Results: No results found.  Anti-infectives: Anti-infectives   Start     Dose/Rate Route Frequency Ordered Stop   03/14/14 1800  vancomycin (VANCOCIN) 1,250 mg in sodium chloride 0.9 % 250 mL IVPB     1,250 mg 166.7 mL/hr over 90 Minutes Intravenous Every 8 hours 03/14/14 0942     03/14/14 1000  vancomycin (VANCOCIN) 1,250 mg in sodium chloride 0.9 % 250 mL IVPB  Status:  Discontinued     1,250 mg 166.7 mL/hr over 90 Minutes Intravenous Every 8 hours 03/14/14 0907 03/14/14 0942   03/13/14 2000  piperacillin-tazobactam (ZOSYN) IVPB 3.375 g     3.375 g 12.5 mL/hr over 240 Minutes Intravenous Every 8 hours 03/13/14 1835     03/13/14 0045  metroNIDAZOLE (FLAGYL) IVPB 500 mg     500 mg 100 mL/hr over 60 Minutes Intravenous 3 times per day 03/13/14 0030     03/13/14 0000  vancomycin (VANCOCIN) IVPB 1000 mg/200 mL premix  Status:  Discontinued     1,000 mg 200 mL/hr over 60 Minutes Intravenous Every 8 hours 03/12/14 2351 03/14/14 0906   03/13/14 0000  Ampicillin-Sulbactam (UNASYN) 3 g in sodium chloride 0.9 % 100 mL IVPB  Status:  Discontinued     3 g 100 mL/hr over 60 Minutes Intravenous Every 6 hours 03/12/14 2354 03/13/14 1825   03/12/14 2045  clindamycin (CLEOCIN) IVPB 600 mg     600 mg 100 mL/hr over 30 Minutes Intravenous  Once 03/12/14 2032 03/12/14 2200       Assessment/Plan 1. MRSA  2. Buttock wound  3. Left thigh cellulitis 4. Right hand abscess  Plan:  1. Await blood cultures. Query bacteremia given multiple sites of infection 2D ECHO negative for vegetations/endocardidits, but may need to consider TEE. 2. No immediate gen surgery indications for buttock/thigh wound.  Continue hydrotherapy for patient's buttock wound to clean it up along with santyl.  Apply heat to left thigh.  Get in shower. 3.  Dr. Mina MarbleWeingold told the patient last night to treat conservatively, but will leave NPO until Dr. Mina MarbleWeingold sees him this  am since he asked us to make him NPO.    LOS: 3 days    [facility], [facility] 03/15/2014, 7:40 AM Pager: [phone]

## 2014-03-15 NOTE — Care Management Note (Signed)
    Page 1 of 1   03/20/2014     1:03:44 PM CARE MANAGEMENT NOTE 03/20/2014  Patient:  Joseph Spence, Joseph Spence   Account Number:  [id number]  Date Initiated:  03/13/2014  Documentation initiated by:  [facility]  Subjective/Objective Assessment:   43 year old male admitted with Sepsis with left gluteal abscess and diffuse skin pustules and Cellulitis with possible abscess over right hand and little finger.     Action/Plan:   From home.   Anticipated DC Date:  03/20/2014   Anticipated DC Plan:  [place]  CM consult  [facility]  [facility]      Choice offered to / List presented to:             Status of service:  Completed, signed off Medicare Important Message given?  NA - LOS <3 / Initial given by admissions (If response is "NO", the following Medicare IM given date fields will be blank) Date Medicare IM given:   Date Additional Medicare IM given:    Discharge Disposition:  HOME/SELF CARE  Per UR Regulation:  Reviewed for med. necessity/level of care/duration of stay  If discussed at [place] of Stay Meetings, dates discussed:    Comments:  03/15/14 Allene Dillon RN BSN [phone] Met with pt and his wife Joseph Spence at bedside to discuss wound care after d/c from hospital. Spouse stated she is good at doing dressing changes and does not need HHRN services. Pt was in agreement with what his wife was saying. She wants to learn how to perform the woundcare while the nurses are doing it. I have asked the bedside RN to teach her and also pass on to her teammates to continue the teaching.  Pt does not have a PCP. I gave them a brochure on [facility] and encouraged them to stop by there on day of d/c to have an appt made for follow up care.

## 2014-03-15 NOTE — Progress Notes (Signed)
03/15/14 1600  Subjective Assessment  Subjective I am glad you are here, it hurts a little but tolerable  Patient and Family Stated Goals to get this thing better  Date of Onset 03/07/14  Evaluation and Treatment  Evaluation and Treatment Procedures Explained to Patient/Family Yes  Evaluation and Treatment Procedures agreed to  Wound / Incision (Open or Dehisced) 03/14/14 Other (Comment) Buttocks Left HYDRO Pulsed Lavage  Date First Assessed/Time First Assessed: 03/14/14 1530   Wound Type: Other (Comment)  Location: Buttocks  Location Orientation: Left  Wound Description (Comments): HYDRO Pulsed Lavage  Present on Admission: Yes  Dressing Type Moist to dry (snatyle, adn 4x4 packing and to cover)  Dressing Status Clean  Dressing Change Frequency Twice a day  Site / Wound Assessment Yellow (fbrin/slough)  % Wound base Red or Granulating 30%  % Wound base Yellow 70%  Peri-wound Assessment Induration;Maceration;Pink;Purple (indurated about 2 centimeters around wound)  Margins Unattached edges (unapproximated)  Closure None  Drainage Amount Minimal  Drainage Description Serosanguineous  Non-staged Wound Description Full thickness  Hydrotherapy  Pulsed Lavage with Suction (psi) 8 psi (up to 12 at times to pt tolerance)  Pulsed Lavage with Suction - Normal Saline Used 1000 mL  Pulsed Lavage Tip Tip with splash shield  Pulsed lavage therapy - wound location L butttocks   Selective Debridement  Selective Debridement - Location l buttocks   Selective Debridement - Tools Used Forceps;Scissors  Selective Debridement - Tissue Removed white fibrin slough (small amounts do to tender with such good blood flow to area)  Wound Therapy - Assess/Plan/Recommendations  Wound Therapy - Clinical Statement Pt to benefit from pulsed lavage to continue to decrease necrotic tissue   Wound Therapy - Functional Problem List Pt with necrotic areas of left buttocks wound  Hydrotherapy Plan Dressing  change;Debridement;Pulsatile lavage with suction;Patient/family education  Wound Therapy - Frequency 6X / week (with nursing to do all other dressing changes on other days )  Wound Therapy - Follow Up Recommendations Wound Care Center  Wound Plan Continue whil here in acute care or until no longer needed to help remove necrotic tissue  Wound Therapy Goals - Improve the function of patient's integumentary system by progressing the wound(s) through the phases of wound healing by:  Decrease Necrotic Tissue to less than 10%  Increase Granulation Tissue to 90% or greater  Improve Drainage Characteristics Min   Felecia ShellingLori Mahum Betten  PTA [facility]  Acute  Rehab Pager      [phone]

## 2014-03-15 NOTE — Progress Notes (Signed)
Spontaneous drainage of right small finger wounds today   No need for surgery   Continue warm soaks  Will sign off for now

## 2014-03-16 LAB — CREATININE, SERUM
Creatinine, Ser: 0.7 mg/dL (ref 0.50–1.35)
GFR calc non Af Amer: >90 mL/min (ref >90)

## 2014-03-16 LAB — CBC
HCT: 35 % — ABNORMAL LOW (ref 39.0–52.0)
Hemoglobin: 11.7 g/dL — ABNORMAL LOW (ref 13.0–17.0)
MCH: 31.1 pg (ref 26.0–34.0)
MCHC: 33.4 g/dL (ref 30.0–36.0)
MCV: 93.1 fL (ref 78.0–100.0)
PLATELETS: 340 10*3/uL (ref 150–400)
RBC: 3.76 MIL/uL — ABNORMAL LOW (ref 4.22–5.81)
RDW: 13.6 % (ref 11.5–15.5)
WBC: 8.3 10*3/uL (ref 4.0–10.5)

## 2014-03-16 LAB — VANCOMYCIN, TROUGH
Vancomycin Tr: 30.2 ug/mL (ref 10.0–20.0)
Vancomycin Tr: 13.6 ug/mL (ref 10.0–20.0)

## 2014-03-16 MED ORDER — VANCOMYCIN HCL 10 G IV SOLR
1500.0000 mg | Freq: Three times a day (TID) | INTRAVENOUS | Status: DC
  Administered 2014-03-17 – 2014-03-18 (×4): 1500 mg via INTRAVENOUS
  Filled 2014-03-16 (×5): qty 1500

## 2014-03-16 NOTE — Progress Notes (Signed)
Pharmacy Consult Note - Vancomycin Follow Up  Labs: vanc trough 13.7  A/P: Vanc trough subtherapeutic (goal 15-20). Will increase dose from 1250mg  IV q8 to 1500mg  IV q8 and recheck trough at next steady state  Hessie KnowsJustin M Barrett Holthaus, PharmD, BCPS Pager [phone] 03/16/2014 5:57 PM

## 2014-03-16 NOTE — Progress Notes (Signed)
Patient ID: Joseph Spence, male   DOB: [birth year], 43 y.o.   MRN: [id number] 3 Days Post-Op  Subjective: Pt feels ok  Objective: Vital signs in last 24 hours: Temp:  [98 F (36.7 C)-98.8 F (37.1 C)] 98 F (36.7 C) (06/18 0544) Pulse Rate:  [45-68] 62 (06/18 0604) Resp:  [16-18] 16 (06/18 0544) BP: (116-132)/(74-88) 132/88 mmHg (06/18 0544) SpO2:  [92 %-97 %] 97 % (06/18 0544) Last BM Date: 03/15/14  Intake/Output from previous day: 06/17 0701 - 06/18 0700 In: 2136.3 [P.O.:240; I.V.:1446.3; IV Piggyback:450] Out: 2575 [Urine:2575] Intake/Output this shift: Total I/O In: 240 [P.O.:240] Out: -   PE: Skin: wound still about Joseph same.  Still with 100% fibrin slough and some surrounding induration  Lab Results:   Recent Labs  03/14/14 0450 03/16/14 0504  WBC 13.8* 8.3  HGB 12.1* 11.7*  HCT 36.6* 35.0*  PLT 339 340   BMET  Recent Labs  03/14/14 0450 03/16/14 0900  NA 138  --   K 3.6*  --   CL 102  --   CO2 27  --   GLUCOSE 100*  --   BUN 7  --   CREATININE 0.57 0.70  CALCIUM 8.1*  --    PT/INR No results found for this basename: LABPROT, INR,  in Joseph last 72 hours CMP     Component Value Date/Time   NA 138 03/14/2014 0450   K 3.6* 03/14/2014 0450   CL 102 03/14/2014 0450   CO2 27 03/14/2014 0450   GLUCOSE 100* 03/14/2014 0450   BUN 7 03/14/2014 0450   CREATININE 0.70 03/16/2014 0900   CALCIUM 8.1* 03/14/2014 0450   PROT 7.0 03/12/2014 2050   ALBUMIN 3.0* 03/12/2014 2050   AST 50* 03/12/2014 2050   ALT 69* 03/12/2014 2050   ALKPHOS 70 03/12/2014 2050   BILITOT 0.5 03/12/2014 2050   GFRNONAA >90 03/16/2014 0900   GFRAA >90 03/16/2014 0900   Lipase  No results found for this basename: lipase       Studies/Results: No results found.  Anti-infectives: Anti-infectives   Start     Dose/Rate Route Frequency Ordered Stop   03/14/14 1800  vancomycin (VANCOCIN) 1,250 mg in sodium chloride 0.9 % 250 mL IVPB     1,250 mg 166.7 mL/hr over 90 Minutes  Intravenous Every 8 hours 03/14/14 0942     03/14/14 1000  vancomycin (VANCOCIN) 1,250 mg in sodium chloride 0.9 % 250 mL IVPB  Status:  Discontinued     1,250 mg 166.7 mL/hr over 90 Minutes Intravenous Every 8 hours 03/14/14 0907 03/14/14 0942   03/13/14 2000  piperacillin-tazobactam (ZOSYN) IVPB 3.375 g     3.375 g 12.5 mL/hr over 240 Minutes Intravenous Every 8 hours 03/13/14 1835     03/13/14 0045  metroNIDAZOLE (FLAGYL) IVPB 500 mg     500 mg 100 mL/hr over 60 Minutes Intravenous 3 times per day 03/13/14 0030     03/13/14 0000  vancomycin (VANCOCIN) IVPB 1000 mg/200 mL premix  Status:  Discontinued     1,000 mg 200 mL/hr over 60 Minutes Intravenous Every 8 hours 03/12/14 2351 03/14/14 0906   03/13/14 0000  Ampicillin-Sulbactam (UNASYN) 3 g in sodium chloride 0.9 % 100 mL IVPB  Status:  Discontinued     3 g 100 mL/hr over 60 Minutes Intravenous Every 6 hours 03/12/14 2354 03/13/14 1825   03/12/14 2045  clindamycin (CLEOCIN) IVPB 600 mg     600 mg 100 mL/hr over 30  Minutes Intravenous  Once 03/12/14 2032 03/12/14 2200       Assessment/Plan  1. Multiple MRSA infection sites, left gluteal wound  Plan: 1. Cont conservative management, but if doesn't improve soon, he may need surgical debridement, likely in OR for wider excision of full induration and necrotic tissue.  Will follow.   LOS: 4 days    OSBORNE,KELLY E 03/16/2014, 11:08 AM Pager: [phone]

## 2014-03-16 NOTE — Progress Notes (Signed)
ANTIBIOTIC CONSULT NOTE   Pharmacy Consult for Vancomycin/Zosyn Indication: Left gluteal abscess with sepsis/ R hand cellulitis/abscess    No Known Allergies  Patient Measurements: Height: 5\' 11"  (180.3 cm) Weight: 150 lb (68.04 kg) IBW/kg (Calculated) : 75.3   Vital Signs: Temp: 98 F (36.7 C) (06/18 0544) Temp src: Oral (06/18 0544) BP: 132/88 mmHg (06/18 0544) Pulse Rate: 62 (06/18 0604) Intake/Output from previous day: 06/17 0701 - 06/18 0700 In: 2136.3 [P.O.:240; I.V.:1446.3; IV Piggyback:450] Out: 2575 [Urine:2575] Intake/Output from this shift: Total I/O In: 240 [P.O.:240] Out: -   Labs:  Recent Labs  03/13/14 1230 03/14/14 0450 03/16/14 0504 03/16/14 0900  WBC 13.7* 13.8* 8.3  --   HGB 12.4* 12.1* 11.7*  --   PLT 297 339 340  --   CREATININE  --  0.57  --  0.70   Estimated Creatinine Clearance: 116.9 ml/min (by C-G formula based on Cr of 0.7).  Recent Labs  03/14/14 0700 03/16/14 0900  VANCOTROUGH 9.3* 30.2*     Microbiology: Recent Results (from the past 720 hour(s))  CULTURE, BLOOD (ROUTINE X 2)     Status: None   Collection Time    03/12/14  8:50 PM      Result Value Ref Range Status   Specimen Description BLOOD RIGHT ANTECUBITAL   Final   Special Requests BOTTLES DRAWN AEROBIC AND ANAEROBIC 5CC   Final   Culture  Setup Time     Final   Value: 03/13/2014 00:45     Performed at [facility]   Culture     Final   Value:        BLOOD CULTURE RECEIVED NO GROWTH TO DATE CULTURE WILL BE HELD FOR 5 DAYS BEFORE ISSUING A FINAL NEGATIVE REPORT     Performed at [facility]   Report Status PENDING   Incomplete  CULTURE, BLOOD (ROUTINE X 2)     Status: None   Collection Time    03/12/14  8:50 PM      Result Value Ref Range Status   Specimen Description BLOOD LEFT ANTECUBITAL   Final   Special Requests BOTTLES DRAWN AEROBIC AND ANAEROBIC 5CC   Final   Culture  Setup Time     Final   Value: 03/13/2014 00:45     Performed at  [facility]   Culture     Final   Value:        BLOOD CULTURE RECEIVED NO GROWTH TO DATE CULTURE WILL BE HELD FOR 5 DAYS BEFORE ISSUING A FINAL NEGATIVE REPORT     Performed at [facility]   Report Status PENDING   Incomplete  CULTURE, ROUTINE-ABSCESS     Status: None   Collection Time    03/12/14 10:03 PM      Result Value Ref Range Status   Specimen Description WOUND BUTTOCKS   Final   Special Requests NONE   Final   Gram Stain     Final   Value: MODERATE WBC PRESENT, PREDOMINANTLY PMN     RARE SQUAMOUS EPITHELIAL CELLS PRESENT     MODERATE GRAM POSITIVE COCCI IN PAIRS     IN CLUSTERS     Performed at [facility]   Culture     Final   Value: MODERATE METHICILLIN RESISTANT STAPHYLOCOCCUS AUREUS     Note: RIFAMPIN AND GENTAMICIN SHOULD NOT BE USED AS SINGLE DRUGS FOR TREATMENT OF STAPH INFECTIONS. This organism DOES NOT demonstrate inducible Clindamycin resistance in vitro.  Performed at [facility]   Report Status 03/15/2014 FINAL   Final   Organism ID, Bacteria METHICILLIN RESISTANT STAPHYLOCOCCUS AUREUS   Final  GC/CHLAMYDIA PROBE AMP     Status: None   Collection Time    03/13/14 12:03 AM      Result Value Ref Range Status   CT Probe RNA NEGATIVE  NEGATIVE Final   GC Probe RNA NEGATIVE  NEGATIVE Final   Comment: (NOTE)                                                                                               **Normal Reference Range: Negative**          Assay performed using the Gen-Probe APTIMA COMBO2 (R) Assay.     Acceptable specimen types for this assay include APTIMA Swabs (Unisex,     endocervical, urethral, or vaginal), first void urine, and ThinPrep     liquid based cytology samples.     Performed at [facility]  SURGICAL PCR SCREEN     Status: Abnormal   Collection Time    03/13/14  9:56 AM      Result Value Ref Range Status   MRSA, PCR POSITIVE (*) NEGATIVE Final   Comment: RESULT CALLED TO, READ BACK BY AND  VERIFIED WITH:     RODRIGUEZ,A @ 1141 ON [id number] BY POTEAT,S   Staphylococcus aureus POSITIVE (*) NEGATIVE Final   Comment:            The Xpert SA Assay (FDA     approved for NASAL specimens     in patients over 321 years of age),     is one component of     a comprehensive surveillance     program.  Test performance has     been validated by [facility] for patients greater     than or equal to 41 year old.     It is not intended     to diagnose infection nor to     guide or monitor treatment.    Medical History: History reviewed. No pertinent past medical history.  Medications:  Scheduled:  . collagenase   Topical Daily  . enoxaparin (LOVENOX) injection  40 mg Subcutaneous QHS  . metronidazole  500 mg Intravenous 3 times per day  . nicotine  14 mg Transdermal Daily  . piperacillin-tazobactam (ZOSYN)  IV  3.375 g Intravenous Q8H  . potassium chloride  40 mEq Oral Once  . vancomycin  1,250 mg Intravenous Q8H   Infusions:  . 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  Concern noted for possible staph bacteremia / endocarditis although blood cultures are negative to date. Vancomycin and Zosyn were started with pharmacy dosing assistance requested.   MD also added Flagyl IV.  Goal of therapy: Appropriate antibiotic dosing for renal function; eradication of infection. Vancomycin  trough 15-20 until bacteremia/endocarditis ruled out  6/18:  D#4 Vancomycin 1250 mg IV q8h (dose increased on 6/16) / Zosyn 3.375 grams IV q8h (extended-infusion) / Flagyl 500 mg IV q8h [per MD])  MRSA in buttock abscess culture noted.  Blood cultures remain negative to date  2D Echo: no vegetations seen.  TEE reportedly being  considered.  Serum creatinine stable.  Vancomycin trough today reported as supratherapeutic but was drawn after dose was already infusing.  Plan: 1. Repeat vancomycin trough at 1700 (before 1800 dose) - reviewed with RN 2. Continue Zosyn 3.375 grams IV q8h (extended-infusion) 3. Recommend considering discontinuation of Flagyl since Zosyn provides anaerobic coverage - decision per MD 4. Await word on planned duration of therapy   Elie Goody, PharmD, BCPS Pager: [phone] 03/16/2014  11:09 AM

## 2014-03-16 NOTE — Progress Notes (Signed)
03/16/14 1600  Subjective Assessment  Subjective N/A  Patient and Family Stated Goals wound base if filling with granulation tissue  Date of Onset 03/07/14  Wound / Incision (Open or Dehisced) 03/12/14 Other (Comment) Buttocks Left;Upper Quarter size open abscess with yellow drainage. tender to palpation and hard around the edges.  Date First Assessed/Time First Assessed: 03/12/14 2115   Wound Type: (c) Other (Comment)  Location: Buttocks  Location Orientation: Left;Upper  Wound Description (Comments): Quarter size open abscess with yellow drainage. tender to palpation and hard aro  Dressing Type Moist to dry  Dressing Status Clean;Dry  Dressing Change Frequency Twice a day  Site / Wound Assessment Yellow  % Wound base Yellow 85%  Margins Attached edges (approximated)  Closure None  Drainage Amount Moderate  Drainage Description Purulent  Wound / Incision (Open or Dehisced) 03/14/14 Other (Comment) Buttocks Left HYDRO Pulsed Lavage  Date First Assessed/Time First Assessed: 03/14/14 1530   Wound Type: Other (Comment)  Location: Buttocks  Location Orientation: Left  Wound Description (Comments): HYDRO Pulsed Lavage  Present on Admission: Yes  Dressing Type Moist to dry (snatyle, adn 4x4 packing and to cover)  Dressing Status Clean  Dressing Change Frequency Twice a day  Site / Wound Assessment Yellow (fbrin/slough)  % Wound base Red or Granulating 35%  % Wound base Yellow 65%  Peri-wound Assessment Induration;Maceration;Pink;Purple (indurated about 2 centimeters around wound)  Margins Unattached edges (unapproximated)  Closure None  Drainage Amount Minimal  Drainage Description Serosanguineous  Non-staged Wound Description Full thickness  Hydrotherapy  Pulsed Lavage with Suction (psi) 8 psi (up to 12 at times to pt tolerance)  Pulsed Lavage with Suction - Normal Saline Used 500 mL  Pulsed Lavage Tip Tip with splash shield  Pulsed lavage therapy - wound location L butttocks    Selective Debridement  Selective Debridement - Location l buttocks   Selective Debridement - Tools Used Forceps;Scissors  Selective Debridement - Tissue Removed white fibrin slough (small amounts do to tender with such good blood flow to area)  Wound Therapy - Assess/Plan/Recommendations  Wound Therapy - Clinical Statement Pt to benefit from pulsed lavage to continue to decrease necrotic tissue   Wound Therapy - Functional Problem List Pt with necrotic areas of left buttocks wound  Hydrotherapy Plan Dressing change;Debridement;Pulsatile lavage with suction;Patient/family education  Wound Therapy - Frequency 6X / week (with nursing to do all other dressing changes on other days )  Wound Therapy - Follow Up Recommendations [facility] Center  Wound Plan Continue whil here in acute care or until no longer needed to help remove necrotic tissue  Wound Therapy Goals - Improve the function of patient's integumentary system by progressing the wound(s) through the phases of wound healing by:  Decrease Necrotic Tissue to less than 10%  Increase Granulation Tissue to 90% or greater  Improve Drainage Characteristics Min

## 2014-03-16 NOTE — Progress Notes (Signed)
[facility] PROGRESS NOTE  Joseph Spence ZOX:[id number] DOB: 1971-08-07 DOA: 03/12/2014 PCP: No primary Joseph Spence on file. 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 He was discharged home on Keflex and Bactrim. Patient reports that 9 days back he had gone camping and shortly after returning from there he noted some erythema and small pustules over the left gluteal area. 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 For past 2 days he also noticed it edema and swelling over his right hand that started over the little finger and extended over the dorsum of his hand. He was unable to flex his fingers and the site became more painful. He reports seeing pustules for the ast 2 days. In ED, he was febrile and he underwent blood cultures and I&D of the left gluteal abscess. He was started on IV antibiotics. Surgery and orthopedics consulted. Dr Mina MarbleWeingold suggested no need for I&d of the right hand finger. Surgery recommended continueing the antibiotics and hydrotherapy. Culture reports from the wound show moderate staph aureus and he is on IV vancomycin and IV zosyn. Blood cultures are negative so far. Echocardiogram ordered in view of the involvement of the multiple skin areas .    Assessment/Plan:  Left gluteal abscess with sepsis: given disseminated areas of pustules and involvement of multiple skin areas with abscesses involving buttock and right hand with hx of IV drug use, staph bacteremia is of concern , echocardiogram ordered as he reports IV drug use 3 months ago. Follow blood cultures.  -General surgery following -Wound Culture growing MRSA, organism susceptible to Vancomycin. Blood  cultures from 03/12/2014 are negative to date -White count trending down to 8.3 from 13.8    Cellulitis with possible abscess over right hand and little finger  Improved, no surgical interventions recommended  Hypokalemia:  Will be repleted as needed.   Tobacco abuse - nicotine patch ordered.   Drug abuse - counseling given.   Leukocytosis: Secondary to the sepsis.    Anemia- normocytic. Anemia panel will be obtained.    Hepatitis C antibody positive.  Elevated liver function tests - h/o drug abuse  DVT prophylaxis.         Code Status: full code Family Communication:family at bedside.  Disposition Plan remain inpatient.    Consultants:  Surgery  Hand surgery- orthpedics.   Procedures:  I&D on admission.   Antibiotics:  Vancomycin6 /15  Zosyn 6/15  unasyn 6/14 till 6/15  Clindamycin 6/14 one dose  Flagyl 6/15  HPI/Subjective: He reports improvement to hand, thinks he is getting better. Tolerating PO intake.   Objective: Filed Vitals:   03/16/14 0604  BP:   Pulse: 62  Temp:   Resp:     Intake/Output Summary (Last 24 hours) at 03/16/14 1119 Last data filed at 03/16/14 1000  Gross per 24 hour  Intake 2376.25 ml  Output   2575 ml  Net -198.75 ml   Filed Weights   03/12/14 1952  Weight: 68.04 kg (150 lb)    Exam:   General:  Alert comfortable,   Cardiovascular: s1s2  Respiratory: ctab  Abdomen: soft NT ND BS+ Musculoskeletal: Multiple small 5 mm pustules spreading diffusely  to the left lower thighs and leg. Swelling of the right hand a week fluctuation over the dorsum of the right hand with a small pus point without any discharge. Swelling of the right little finger with a small pus point as well . Warm and very tender to palpation. Tender to pressure over right of this joint. Patient unable to fully flex his fingers . No needle marks seen .      Data Reviewed: Basic Metabolic Panel:  Recent Labs Lab 03/12/14 2050  03/13/14 0505 03/14/14 0450 03/16/14 0900  NA 132* 140 138  --   K 3.3* 3.5* 3.6*  --   CL 96 101 102  --   CO2 23 27 27   --   GLUCOSE 87 100* 100*  --   BUN 8 9 7   --   CREATININE 0.63 0.72 0.57 0.70  CALCIUM 8.5 8.4 8.1*  --    Liver Function Tests:  Recent Labs Lab 03/12/14 2050  AST 50*  ALT 69*  ALKPHOS 70  BILITOT 0.5  PROT 7.0  ALBUMIN 3.0*   No results found for this basename: LIPASE, AMYLASE,  in the last 168 hours No results found for this basename: AMMONIA,  in the last 168 hours CBC:  Recent Labs Lab 03/12/14 2050 03/13/14 1230 03/14/14 0450 03/16/14 0504  WBC 15.5* 13.7* 13.8* 8.3  NEUTROABS 11.5*  --   --   --   HGB 12.6* 12.4* 12.1* 11.7*  HCT 37.6* 36.1* 36.6* 35.0*  MCV 92.4 91.2 93.1 93.1  PLT 303 297 339 340   Cardiac Enzymes: No results found for this basename: CKTOTAL, CKMB, CKMBINDEX, TROPONINI,  in the last 168 hours BNP (last 3 results) No results found for this basename: PROBNP,  in the last 8760 hours CBG: No results found for this basename: GLUCAP,  in the last 168 hours  Recent Results (from the past 240 hour(s))  CULTURE, BLOOD (ROUTINE X 2)     Status: None   Collection Time    03/12/14  8:50 PM      Result Value Ref Range Status   Specimen Description BLOOD RIGHT ANTECUBITAL   Final   Special Requests BOTTLES DRAWN AEROBIC AND ANAEROBIC 5CC   Final   Culture  Setup Time     Final   Value: 03/13/2014 00:45     Performed at [facility]   Culture     Final   Value:        BLOOD CULTURE RECEIVED NO GROWTH TO DATE CULTURE WILL BE HELD FOR 5 DAYS BEFORE ISSUING A FINAL NEGATIVE REPORT     Performed at [facility]   Report Status PENDING   Incomplete  CULTURE, BLOOD (ROUTINE X 2)     Status: None   Collection Time    03/12/14  8:50 PM      Result Value Ref Range Status   Specimen Description BLOOD LEFT ANTECUBITAL   Final   Special Requests BOTTLES DRAWN AEROBIC AND ANAEROBIC 5CC   Final   Culture  Setup  Time     Final   Value: 03/13/2014 00:45     Performed at [facility]   Culture     Final   Value:        BLOOD CULTURE RECEIVED NO GROWTH TO DATE CULTURE WILL BE HELD FOR 5 DAYS BEFORE ISSUING A FINAL NEGATIVE REPORT     Performed at [facility]   Report Status PENDING  Incomplete  CULTURE, ROUTINE-ABSCESS     Status: None   Collection Time    03/12/14 10:03 PM      Result Value Ref Range Status   Specimen Description WOUND BUTTOCKS   Final   Special Requests NONE   Final   Gram Stain     Final   Value: MODERATE WBC PRESENT, PREDOMINANTLY PMN     RARE SQUAMOUS EPITHELIAL CELLS PRESENT     MODERATE GRAM POSITIVE COCCI IN PAIRS     IN CLUSTERS     Performed at [facility]   Culture     Final   Value: MODERATE METHICILLIN RESISTANT STAPHYLOCOCCUS AUREUS     Note: RIFAMPIN AND GENTAMICIN SHOULD NOT BE USED AS SINGLE DRUGS FOR TREATMENT OF STAPH INFECTIONS. This organism DOES NOT demonstrate inducible Clindamycin resistance in vitro.     Performed at [facility]   Report Status 03/15/2014 FINAL   Final   Organism ID, Bacteria METHICILLIN RESISTANT STAPHYLOCOCCUS AUREUS   Final  GC/CHLAMYDIA PROBE AMP     Status: None   Collection Time    03/13/14 12:03 AM      Result Value Ref Range Status   CT Probe RNA NEGATIVE  NEGATIVE Final   GC Probe RNA NEGATIVE  NEGATIVE Final   Comment: (NOTE)                                                                                               **Normal Reference Range: Negative**          Assay performed using the Gen-Probe APTIMA COMBO2 (R) Assay.     Acceptable specimen types for this assay include APTIMA Swabs (Unisex,     endocervical, urethral, or vaginal), first void urine, and ThinPrep     liquid based cytology samples.     Performed at [facility]  SURGICAL PCR SCREEN     Status: Abnormal   Collection Time    03/13/14  9:56 AM      Result Value Ref Range Status   MRSA, PCR POSITIVE (*)  NEGATIVE Final   Comment: RESULT CALLED TO, READ BACK BY AND VERIFIED WITH:     RODRIGUEZ,A @ 1141 ON [id number] BY POTEAT,S   Staphylococcus aureus POSITIVE (*) NEGATIVE Final   Comment:            The Xpert SA Assay (FDA     approved for NASAL specimens     in patients over 52 years of age),     is one component of     a comprehensive surveillance     program.  Test performance has     been validated by [facility] for patients greater     than or equal to 61 year old.     It is not intended     to diagnose infection nor to     guide or monitor treatment.     Studies: No results found.  Scheduled Meds: . collagenase   Topical Daily  . enoxaparin (LOVENOX) injection  40 mg Subcutaneous QHS  .  metronidazole  500 mg Intravenous 3 times per day  . nicotine  14 mg Transdermal Daily  . piperacillin-tazobactam (ZOSYN)  IV  3.375 g Intravenous Q8H  . potassium chloride  40 mEq Oral Once  . vancomycin  1,250 mg Intravenous Q8H   Continuous Infusions: . sodium chloride 75 mL/hr at 03/16/14 0213    Principal Problem:   Abscess, gluteal, left Active Problems:   Fever   Sepsis affecting skin   Hypokalemia   Cellulitis of right hand   Cellulitis of right little finger   Abscess of buttock, left    Time spent: 25 minutes.     Jeralyn Bennett  [facility] Pager [phone]. If 7PM-7AM, please contact night-coverage at [email or web address], password [facility] 03/16/2014, 11:19 AM  LOS: 4 days

## 2014-03-17 DIAGNOSIS — L02519 Cutaneous abscess of unspecified hand: Secondary | ICD-10-CM

## 2014-03-17 DIAGNOSIS — L03019 Cellulitis of unspecified finger: Secondary | ICD-10-CM

## 2014-03-17 MED ORDER — HYDROMORPHONE HCL PF 1 MG/ML IJ SOLN
1.0000 mg | INTRAMUSCULAR | Status: DC | PRN
  Administered 2014-03-17 – 2014-03-19 (×8): 1 mg via INTRAVENOUS
  Filled 2014-03-17 (×8): qty 1

## 2014-03-17 NOTE — Progress Notes (Signed)
[facility] PROGRESS NOTE  Joseph Spence ONG:[id number] DOB: [birth year] DOA: 03/12/2014 PCP: No primary Roxy Mastandrea on file. 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 He was discharged home on Keflex and Bactrim. Patient reports that 9 days back he had gone camping and shortly after returning from there he noted some erythema and small pustules over the left gluteal area. 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 For past 2 days he also noticed it edema and swelling over his right hand that started over the little finger and extended over the dorsum of his hand. He was unable to flex his fingers and the site became more painful. He reports seeing pustules for the ast 2 days. In ED, he was febrile and he underwent blood cultures and I&D of the left gluteal abscess. He was started on IV antibiotics. Surgery and orthopedics consulted. Dr Mina MarbleWeingold suggested no need for I&d of the right hand finger. Surgery recommended continueing the antibiotics and hydrotherapy. Culture reports from the wound show moderate staph aureus and he is on IV vancomycin and IV zosyn. Blood cultures are negative so far. Echocardiogram ordered in view of the involvement of the multiple skin areas .    Assessment/Plan:  Left gluteal MRSA abscess given disseminated areas of pustules and involvement of multiple skin areas with abscesses involving buttock and right hand with hx of IV drug use, staph bacteremia is of concern (however, BC remain negative to date) , echocardiogram without evidence for valvular vegetation..  -General surgery following -Wound Culture growing MRSA, organism susceptible to  Vancomycin. Blood cultures from 03/12/2014 are negative to date -White count trending down to 8.3 from 13.8 -Wound care as per surgical recommendations (continue hydrotherapy for now).    Cellulitis with possible abscess over right hand and little finger  Improved, no surgical interventions recommended. Seen by Dr. Mina MarbleWeingold  Hypokalemia:  Repleted  Tobacco abuse - nicotine patch ordered.   Drug abuse - counseling given.   Leukocytosis: Resolved. -2/2 gluteal abscess    Anemia- normocytic. Appears to be anemia of chronic disease per anemia panel.   Hepatitis C antibody positive.  Elevated liver function tests - h/o drug abuse  DVT prophylaxis.         Code Status: full code Family Communication:Patient only  Disposition Plan remain inpatient.    Consultants:  Surgery  Hand surgery- orthpedics.   Procedures:  I&D on admission.   Antibiotics:  Vancomycin6 /15  Zosyn 6/15  unasyn 6/14 till 6/15  Clindamycin 6/14 one dose  Flagyl 6/15  HPI/Subjective: He reports improvement to hand, thinks he is getting better. Tolerating PO intake.   Objective: Filed Vitals:   03/17/14 1401  BP:   Pulse: 52  Temp:   Resp:     Intake/Output Summary (Last 24 hours) at 03/17/14 1422 Last data filed at 03/17/14 1400  Gross per 24 hour  Intake   3090 ml  Output   2950 ml  Net    140 ml   Filed Weights   03/12/14 1952  Weight: 68.04 kg (150 lb)    Exam:   General:  Alert comfortable,  Cardiovascular: s1s2  Respiratory: ctab  Abdomen: soft NT ND BS+ Musculoskeletal: Multiple small 5 mm pustules spreading diffusely to the left lower thighs and leg. Swelling of the right hand a week fluctuation over the dorsum of the right hand with a small pus point without any discharge. Swelling of the right little finger with a small pus point as well . Warm and very tender to palpation. Tender to pressure over right of this joint. Patient unable to fully flex  his fingers . No needle marks seen .      Data Reviewed: Basic Metabolic Panel:  Recent Labs Lab 03/12/14 2050 03/13/14 0505 03/14/14 0450 03/16/14 0900  NA 132* 140 138  --   K 3.3* 3.5* 3.6*  --   CL 96 101 102  --   CO2 23 27 27   --   GLUCOSE 87 100* 100*  --   BUN 8 9 7   --   CREATININE 0.63 0.72 0.57 0.70  CALCIUM 8.5 8.4 8.1*  --    Liver Function Tests:  Recent Labs Lab 03/12/14 2050  AST 50*  ALT 69*  ALKPHOS 70  BILITOT 0.5  PROT 7.0  ALBUMIN 3.0*   No results found for this basename: LIPASE, AMYLASE,  in the last 168 hours No results found for this basename: AMMONIA,  in the last 168 hours CBC:  Recent Labs Lab 03/12/14 2050 03/13/14 1230 03/14/14 0450 03/16/14 0504  WBC 15.5* 13.7* 13.8* 8.3  NEUTROABS 11.5*  --   --   --   HGB 12.6* 12.4* 12.1* 11.7*  HCT 37.6* 36.1* 36.6* 35.0*  MCV 92.4 91.2 93.1 93.1  PLT 303 297 339 340   Cardiac Enzymes: No results found for this basename: CKTOTAL, CKMB, CKMBINDEX, TROPONINI,  in the last 168 hours BNP (last 3 results) No results found for this basename: PROBNP,  in the last 8760 hours CBG: No results found for this basename: GLUCAP,  in the last 168 hours  Recent Results (from the past 240 hour(s))  CULTURE, BLOOD (ROUTINE X 2)     Status: None   Collection Time    03/12/14  8:50 PM      Result Value Ref Range Status   Specimen Description BLOOD RIGHT ANTECUBITAL   Final   Special Requests BOTTLES DRAWN AEROBIC AND ANAEROBIC 5CC   Final   Culture  Setup Time     Final   Value: 03/13/2014 00:45     Performed at [facility]   Culture     Final   Value:        BLOOD CULTURE RECEIVED NO GROWTH TO DATE CULTURE WILL BE HELD FOR 5 DAYS BEFORE ISSUING A FINAL NEGATIVE REPORT     Performed at [facility]   Report Status PENDING   Incomplete  CULTURE, BLOOD (ROUTINE X 2)     Status: None   Collection Time    03/12/14  8:50 PM      Result Value Ref Range Status   Specimen  Description BLOOD LEFT ANTECUBITAL   Final   Special Requests BOTTLES DRAWN AEROBIC AND ANAEROBIC 5CC   Final   Culture  Setup Time     Final   Value: 03/13/2014 00:45     Performed at [facility]   Culture     Final   Value:        BLOOD CULTURE RECEIVED NO GROWTH TO DATE CULTURE WILL BE HELD FOR 5 DAYS BEFORE ISSUING A  FINAL NEGATIVE REPORT     Performed at [facility]   Report Status PENDING   Incomplete  CULTURE, ROUTINE-ABSCESS     Status: None   Collection Time    03/12/14 10:03 PM      Result Value Ref Range Status   Specimen Description WOUND BUTTOCKS   Final   Special Requests NONE   Final   Gram Stain     Final   Value: MODERATE WBC PRESENT, PREDOMINANTLY PMN     RARE SQUAMOUS EPITHELIAL CELLS PRESENT     MODERATE GRAM POSITIVE COCCI IN PAIRS     IN CLUSTERS     Performed at [facility]   Culture     Final   Value: MODERATE METHICILLIN RESISTANT STAPHYLOCOCCUS AUREUS     Note: RIFAMPIN AND GENTAMICIN SHOULD NOT BE USED AS SINGLE DRUGS FOR TREATMENT OF STAPH INFECTIONS. This organism DOES NOT demonstrate inducible Clindamycin resistance in vitro.     Performed at [facility]   Report Status 03/15/2014 FINAL   Final   Organism ID, Bacteria METHICILLIN RESISTANT STAPHYLOCOCCUS AUREUS   Final  GC/CHLAMYDIA PROBE AMP     Status: None   Collection Time    03/13/14 12:03 AM      Result Value Ref Range Status   CT Probe RNA NEGATIVE  NEGATIVE Final   GC Probe RNA NEGATIVE  NEGATIVE Final   Comment: (NOTE)                                                                                               **Normal Reference Range: Negative**          Assay performed using the Gen-Probe APTIMA COMBO2 (R) Assay.     Acceptable specimen types for this assay include APTIMA Swabs (Unisex,     endocervical, urethral, or vaginal), first void urine, and ThinPrep     liquid based cytology samples.     Performed at [facility]  SURGICAL PCR SCREEN      Status: Abnormal   Collection Time    03/13/14  9:56 AM      Result Value Ref Range Status   MRSA, PCR POSITIVE (*) NEGATIVE Final   Comment: RESULT CALLED TO, READ BACK BY AND VERIFIED WITH:     RODRIGUEZ,A @ 1141 ON [id number] BY POTEAT,S   Staphylococcus aureus POSITIVE (*) NEGATIVE Final   Comment:            The Xpert SA Assay (FDA     approved for NASAL specimens     in patients over 41 years of age),     is one component of     a comprehensive surveillance     program.  Test performance has     been validated by [facility] for patients greater     than or equal to 41 year old.     It is not intended     to diagnose infection nor to     guide or monitor treatment.     Studies: No results found.  Scheduled  Meds: . collagenase   Topical Daily  . enoxaparin (LOVENOX) injection  40 mg Subcutaneous QHS  . metronidazole  500 mg Intravenous 3 times per day  . nicotine  14 mg Transdermal Daily  . piperacillin-tazobactam (ZOSYN)  IV  3.375 g Intravenous Q8H  . potassium chloride  40 mEq Oral Once  . vancomycin  1,500 mg Intravenous Q8H   Continuous Infusions: . sodium chloride 75 mL/hr at 03/17/14 1610    Principal Problem:   Abscess, gluteal, left Active Problems:   Fever   Sepsis affecting skin   Hypokalemia   Cellulitis of right hand   Cellulitis of right little finger   Abscess of buttock, left    Time spent: 25 minutes.     [facility]  [facility] Pager [phone].  If 7PM-7AM, please contact night-coverage at [email or web address], password [email or web address] 03/17/2014, 2:22 PM  LOS: 5 days

## 2014-03-17 NOTE — Progress Notes (Signed)
Patient ID: Joseph Spence, male   DOB: [birth year], 43 y.o.   MRN: [id number] 4 Days Post-Op  Subjective: No new c/o  Objective: Vital signs in last 24 hours: Temp:  [98.4 F (36.9 C)-99.5 F (37.5 C)] 98.4 F (36.9 C) (06/19 0516) Pulse Rate:  [53-76] 55 (06/19 0516) Resp:  [16] 16 (06/19 0516) BP: (131-145)/(76-85) 136/78 mmHg (06/19 0516) SpO2:  [90 %-97 %] 97 % (06/19 0516) Last BM Date: 03/16/14  Intake/Output from previous day: 06/18 0701 - 06/19 0700 In: 4683.8 [P.O.:940; I.V.:1793.8; IV Piggyback:1950] Out: 2200 [Urine:2200] Intake/Output this shift: Total I/O In: 740 [P.O.:240; IV Piggyback:500] Out: 950 [Urine:950]  PE: Skin: buttock wound is looking much better today.  Less fibrin tissue with some granulation tissue showing through  Lab Results:   Recent Labs  03/16/14 0504  WBC 8.3  HGB 11.7*  HCT 35.0*  PLT 340   BMET  Recent Labs  03/16/14 0900  CREATININE 0.70   PT/INR No results found for this basename: LABPROT, INR,  in the last 72 hours CMP     Component Value Date/Time   NA 138 03/14/2014 0450   K 3.6* 03/14/2014 0450   CL 102 03/14/2014 0450   CO2 27 03/14/2014 0450   GLUCOSE 100* 03/14/2014 0450   BUN 7 03/14/2014 0450   CREATININE 0.70 03/16/2014 0900   CALCIUM 8.1* 03/14/2014 0450   PROT 7.0 03/12/2014 2050   ALBUMIN 3.0* 03/12/2014 2050   AST 50* 03/12/2014 2050   ALT 69* 03/12/2014 2050   ALKPHOS 70 03/12/2014 2050   BILITOT 0.5 03/12/2014 2050   GFRNONAA >90 03/16/2014 0900   GFRAA >90 03/16/2014 0900   Lipase  No results found for this basename: lipase       Studies/Results: No results found.  Anti-infectives: Anti-infectives   Start     Dose/Rate Route Frequency Ordered Stop   03/17/14 0200  vancomycin (VANCOCIN) 1,500 mg in sodium chloride 0.9 % 500 mL IVPB     1,500 mg 250 mL/hr over 120 Minutes Intravenous Every 8 hours 03/16/14 1757     03/14/14 1800  vancomycin (VANCOCIN) 1,250 mg in sodium chloride 0.9 % 250 mL IVPB   Status:  Discontinued     1,250 mg 166.7 mL/hr over 90 Minutes Intravenous Every 8 hours 03/14/14 0942 03/16/14 1757   03/14/14 1000  vancomycin (VANCOCIN) 1,250 mg in sodium chloride 0.9 % 250 mL IVPB  Status:  Discontinued     1,250 mg 166.7 mL/hr over 90 Minutes Intravenous Every 8 hours 03/14/14 0907 03/14/14 0942   03/13/14 2000  piperacillin-tazobactam (ZOSYN) IVPB 3.375 g     3.375 g 12.5 mL/hr over 240 Minutes Intravenous Every 8 hours 03/13/14 1835     03/13/14 0045  metroNIDAZOLE (FLAGYL) IVPB 500 mg     500 mg 100 mL/hr over 60 Minutes Intravenous 3 times per day 03/13/14 0030     03/13/14 0000  vancomycin (VANCOCIN) IVPB 1000 mg/200 mL premix  Status:  Discontinued     1,000 mg 200 mL/hr over 60 Minutes Intravenous Every 8 hours 03/12/14 2351 03/14/14 0906   03/13/14 0000  Ampicillin-Sulbactam (UNASYN) 3 g in sodium chloride 0.9 % 100 mL IVPB  Status:  Discontinued     3 g 100 mL/hr over 60 Minutes Intravenous Every 6 hours 03/12/14 2354 03/13/14 1825   03/12/14 2045  clindamycin (CLEOCIN) IVPB 600 mg     600 mg 100 mL/hr over 30 Minutes Intravenous  Once 03/12/14 2032 03/12/14  2200       Assessment/Plan  1. Left gluteal abscess  Plan: 1. Another 1-2 days of hydrotherapy should hopefully clean the wound up enough that he can then go home with WD dressing changes.  Wife knows how to do these changes.   LOS: 5 days    Ashyra Cantin E 03/17/2014, 11:24 AM Pager: [phone]

## 2014-03-17 NOTE — Progress Notes (Signed)
Physical Therapy Hydrotherapy Treatment Note   03/17/14 1700  Subjective Assessment  Subjective No, It doesn't hurt.  Date of Onset 03/07/14  Evaluation and Treatment  Evaluation and Treatment Procedures Explained to Patient/Family Yes  Evaluation and Treatment Procedures agreed to  Wound / Incision (Open or Dehisced) 03/14/14 Other (Comment) Buttocks Left HYDRO Pulsed Lavage  Date First Assessed/Time First Assessed: 03/14/14 1530   Wound Type: Other (Comment)  Location: Buttocks  Location Orientation: Left  Wound Description (Comments): HYDRO Pulsed Lavage  Present on Admission: Yes  Dressing Type Moist to dry;Silicone dressing (santyl, 4x4 wet to dry and covered with Allyven)  Dressing Changed Changed  Dressing Status Clean  Dressing Change Frequency Twice a day  Site / Wound Assessment Yellow (fbrin/slough)  % Wound base Red or Granulating 35%  % Wound base Yellow 65%  Peri-wound Assessment Induration;Maceration;Pink;Purple (indurated about 2 centimeters around wound)  Margins Unattached edges (unapproximated)  Closure None  Drainage Amount Minimal  Drainage Description Serosanguineous  Non-staged Wound Description Full thickness  Treatment Hydrotherapy (Pulse lavage);Debridement (Selective);Packing (Saline gauze) (santyl, saline guaze, dry gauze, covered with Allyven)  Hydrotherapy  Pulsed Lavage with Suction (psi) 8 psi (up to 12 at times to pt tolerance)  Pulsed Lavage with Suction - Normal Saline Used 1000 mL  Pulsed Lavage Tip Tip with splash shield  Pulsed lavage therapy - wound location L butttocks   Selective Debridement  Selective Debridement - Location Did not debride today, may attempt tomorrow  Wound Therapy - Assess/Plan/Recommendations  Wound Therapy - Clinical Statement Pt to benefit from pulsed lavage to continue to decrease necrotic tissue   Wound Therapy - Functional Problem List Pt with necrotic areas of left buttocks wound  Hydrotherapy Plan Dressing  change;Debridement;Pulsatile lavage with suction;Patient/family education  Wound Therapy - Frequency 6X / week (with nursing to do all other dressing changes on other days )  Wound Therapy - Follow Up Recommendations Home health RN  Wound Plan Continue while here in acute care or until no longer needed to help remove necrotic tissue  Wound Therapy Goals - Improve the function of patient's integumentary system by progressing the wound(s) through the phases of wound healing by:  Decrease Necrotic Tissue to less than 10%  Decrease Necrotic Tissue - Progress Progressing toward goal  Increase Granulation Tissue to 90% or greater  Increase Granulation Tissue - Progress Progressing toward goal  Improve Drainage Characteristics Min  Improve Drainage Characteristics - Progress Progressing toward goal   Premedicated for treatment  Zenovia JarredKati Lemyre, PT, DPT 03/17/2014 Pager: [phone]

## 2014-03-18 LAB — CREATININE, SERUM
CREATININE: 0.73 mg/dL (ref 0.50–1.35)
GFR calc Af Amer: >90 mL/min (ref >90)
GFR calc non Af Amer: >90 mL/min (ref >90)

## 2014-03-18 LAB — VANCOMYCIN, TROUGH: Vancomycin Tr: 26 ug/mL (ref 10.0–20.0)

## 2014-03-18 MED ORDER — VANCOMYCIN HCL IN DEXTROSE 1-5 GM/200ML-% IV SOLN
1000.0000 mg | Freq: Three times a day (TID) | INTRAVENOUS | Status: DC
  Administered 2014-03-18 – 2014-03-20 (×6): 1000 mg via INTRAVENOUS
  Filled 2014-03-18 (×7): qty 200

## 2014-03-18 NOTE — Progress Notes (Signed)
vanc trough called to me=26. Called to pharmacist. vanc held

## 2014-03-18 NOTE — Progress Notes (Signed)
Physical Therapy Hydrotherapy Note   03/18/14 1600  Subjective Assessment  Subjective I'm bored.  Can you get the RN to give me some more socks so I can walk?  Date of Onset 03/07/14  Evaluation and Treatment  Evaluation and Treatment Procedures Explained to Patient/Family Yes  Evaluation and Treatment Procedures agreed to  Wound / Incision (Open or Dehisced) 03/14/14 Other (Comment) Buttocks Left HYDRO Pulsed Lavage  Date First Assessed/Time First Assessed: 03/14/14 1530   Wound Type: Other (Comment)  Location: Buttocks  Location Orientation: Left  Wound Description (Comments): HYDRO Pulsed Lavage  Present on Admission: Yes  Dressing Type Moist to dry;Silicone dressing (santyl, 4x4 wet to dry and covered with Allyven)  Dressing Changed Changed  Dressing Status Clean  Dressing Change Frequency Twice a day  Site / Wound Assessment Yellow (fbrin/slough)  % Wound base Red or Granulating 45%  % Wound base Yellow 55%  Peri-wound Assessment Induration;Maceration;Pink;Purple (indurated about 2 centimeters around wound)  Margins Unattached edges (unapproximated)  Closure None  Drainage Amount Minimal  Drainage Description Serosanguineous  Non-staged Wound Description Full thickness  Treatment Debridement (Selective);Hydrotherapy (Pulse lavage);Packing (Saline gauze) (santyl, saline guaze, dry gauze, covered with Allyven)  Hydrotherapy  Pulsed Lavage with Suction (psi) 8 psi (up to 12 at times to pt tolerance)  Pulsed Lavage with Suction - Normal Saline Used 1000 mL  Pulsed Lavage Tip Tip with splash shield  Pulsed lavage therapy - wound location L butttocks   Selective Debridement  Selective Debridement - Location removed scant amount of yellow slough tissue however very adherent to wound bed.  granulating tissue present and good blood supply, slight bleeding after pulsatile lavage; red granulating tissue more prevalent in lateral area of wound  Selective Debridement - Tools Used Forceps   Selective Debridement - Tissue Removed yellow slough  Wound Therapy - Assess/Plan/Recommendations  Wound Therapy - Clinical Statement Pt to benefit from pulsed lavage to continue to decrease necrotic tissue   Wound Therapy - Functional Problem List Pt with necrotic areas of left buttocks wound  Hydrotherapy Plan Dressing change;Debridement;Pulsatile lavage with suction;Patient/family education  Wound Therapy - Frequency 6X / week (with nursing to do all other dressing changes on other days )  Wound Therapy - Follow Up Recommendations Home health RN  Wound Plan Continue while here in acute care or until no longer needed to help remove necrotic tissue  Wound Therapy Goals - Improve the function of patient's integumentary system by progressing the wound(s) through the phases of wound healing by:  Decrease Necrotic Tissue to less than 10%  Decrease Necrotic Tissue - Progress Progressing toward goal  Increase Granulation Tissue to 90% or greater  Increase Granulation Tissue - Progress Progressing toward goal  Improve Drainage Characteristics Min  Improve Drainage Characteristics - Progress Progressing toward goal   Zenovia JarredKati Lemyre, PT, DPT 03/18/2014 Pager: [phone]

## 2014-03-18 NOTE — Progress Notes (Signed)
Patient ID: Joseph Spence, male   DOB: February 27, 1971, 43 y.o.   MRN: [id number] [facility] Progress Note:   5 Days Post-Op  Subjective: Mental status is clear Objective: Vital signs in last 24 hours: Temp:  [97.9 F (36.6 C)-98.8 F (37.1 C)] 97.9 F (36.6 C) (06/20 0545) Pulse Rate:  [46-53] 46 (06/20 0545) Resp:  [14-18] 14 (06/20 0545) BP: (131-147)/(81-86) 144/83 mmHg (06/20 0545) SpO2:  [91 %-99 %] 96 % (06/20 0545)  Intake/Output from previous day: 06/19 0701 - 06/20 0700 In: 3853.8 [P.O.:830; I.V.:1573.8; IV Piggyback:1450] Out: 2675 [Urine:2675] Intake/Output this shift:    Physical Exam: Work of breathing is normal.  Buttock wound is still draining   Lab Results:  Results for orders placed during the hospital encounter of 03/12/14 (from the past 48 hour(s))  VANCOMYCIN, TROUGH     Status: Abnormal   Collection Time    03/16/14  9:00 AM      Result Value Ref Range   Vancomycin Tr 30.2 (*) 10.0 - 20.0 ug/mL   Comment: CRITICAL RESULT CALLED TO, READ BACK BY AND VERIFIED WITH:     STATEN,C AT 1015 ON [id number] BY POTEAT,S  CREATININE, SERUM     Status: None   Collection Time    03/16/14  9:00 AM      Result Value Ref Range   Creatinine, Ser 0.70  0.50 - 1.35 mg/dL   GFR calc non Af Amer >90  >90 mL/min   GFR calc Af Amer >90  >90 mL/min   Comment: (NOTE)     The eGFR has been calculated using the CKD EPI equation.     This calculation has not been validated in all clinical situations.     eGFR's persistently <90 mL/min signify possible Chronic Kidney     Disease.  VANCOMYCIN, TROUGH     Status: None   Collection Time    03/16/14  4:43 PM      Result Value Ref Range   Vancomycin Tr 13.6  10.0 - 20.0 ug/mL    Radiology/Results: No results found.  Anti-infectives: Anti-infectives   Start     Dose/Rate Route Frequency Ordered Stop   03/17/14 0200  vancomycin (VANCOCIN) 1,500 mg in sodium chloride 0.9 % 500 mL IVPB     1,500 mg 250 mL/hr over 120  Minutes Intravenous Every 8 hours 03/16/14 1757     03/14/14 1800  vancomycin (VANCOCIN) 1,250 mg in sodium chloride 0.9 % 250 mL IVPB  Status:  Discontinued     1,250 mg 166.7 mL/hr over 90 Minutes Intravenous Every 8 hours 03/14/14 0942 03/16/14 1757   03/14/14 1000  vancomycin (VANCOCIN) 1,250 mg in sodium chloride 0.9 % 250 mL IVPB  Status:  Discontinued     1,250 mg 166.7 mL/hr over 90 Minutes Intravenous Every 8 hours 03/14/14 0907 03/14/14 0942   03/13/14 2000  piperacillin-tazobactam (ZOSYN) IVPB 3.375 g     3.375 g 12.5 mL/hr over 240 Minutes Intravenous Every 8 hours 03/13/14 1835     03/13/14 0045  metroNIDAZOLE (FLAGYL) IVPB 500 mg     500 mg 100 mL/hr over 60 Minutes Intravenous 3 times per day 03/13/14 0030     03/13/14 0000  vancomycin (VANCOCIN) IVPB 1000 mg/200 mL premix  Status:  Discontinued     1,000 mg 200 mL/hr over 60 Minutes Intravenous Every 8 hours 03/12/14 2351 03/14/14 0906   03/13/14 0000  Ampicillin-Sulbactam (UNASYN) 3 g in sodium chloride 0.9 % 100 mL  IVPB  Status:  Discontinued     3 g 100 mL/hr over 60 Minutes Intravenous Every 6 hours 03/12/14 2354 03/13/14 1825   03/12/14 2045  clindamycin (CLEOCIN) IVPB 600 mg     600 mg 100 mL/hr over 30 Minutes Intravenous  Once 03/12/14 2032 03/12/14 2200      Assessment/Plan: Problem List: Patient Active Problem List   Diagnosis Date Noted  . Abscess of buttock, left 03/13/2014  . Fever 03/12/2014  . Abscess, gluteal, left 03/12/2014  . Sepsis affecting skin 03/12/2014  . Hypokalemia 03/12/2014  . Cellulitis of right hand 03/12/2014  . Cellulitis of right little finger 03/12/2014    Relying on hydrotherapy at the bedside.   5 Days Post-Op    LOS: 6 days   Matt B. Hassell Done, MD, [facility] [phone] beeper [phone]  03/18/2014 8:05 AM

## 2014-03-18 NOTE — Progress Notes (Signed)
[facility] PROGRESS NOTE  Joseph Spence NGE:[id number] DOB: 31-Oct-1970 DOA: 03/12/2014 PCP: No primary provider on file. 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 He was discharged home on Keflex and Bactrim. Patient reports that 9 days back he had gone camping and shortly after returning from there he noted some erythema and small pustules over the left gluteal area. 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 For past 2 days he also noticed it edema and swelling over his right hand that started over the little finger and extended over the dorsum of his hand. He was unable to flex his fingers and the site became more painful. He reports seeing pustules for the ast 2 days. In ED, he was febrile and he underwent blood cultures and I&D of the left gluteal abscess. He was started on IV antibiotics. Surgery and orthopedics consulted. Dr Mina Marble suggested no need for I&d of the right hand finger. Surgery recommended continueing the antibiotics and hydrotherapy. Culture reports from the wound show moderate staph aureus and he is on IV vancomycin and IV zosyn. Blood cultures are negative so far. Echocardiogram ordered in view of the involvement of the multiple skin areas .    Assessment/Plan:  Left gluteal MRSA abscess given disseminated areas of pustules and involvement of multiple skin areas with abscesses involving buttock and right hand with hx of IV drug use, staph bacteremia is of concern (however, BC remain negative to date) , echocardiogram without evidence for valvular vegetation..  -General surgery following -Wound Culture growing MRSA, organism susceptible to  Vancomycin. Blood cultures from 03/12/2014 are negative to date -White count trending down to 8.3 from 13.8 -Wound care as per surgical recommendations (continue hydrotherapy for now).    Cellulitis with possible abscess over right hand and little finger  Improved, no surgical interventions recommended. Seen by Dr. Mina Marble  Hypokalemia:  Repleted  Tobacco abuse - nicotine patch ordered.   Drug abuse - counseling given.   Leukocytosis: Resolved. -2/2 gluteal abscess    Anemia- normocytic. Appears to be anemia of chronic disease per anemia panel.   Hepatitis C antibody positive.  Elevated liver function tests - h/o drug abuse  DVT prophylaxis.         Code Status: full code Family Communication:Patient only  Disposition Plan remain inpatient.    Consultants:  Surgery  Hand surgery- orthpedics.   Procedures:  I&D on admission.   Antibiotics:  Vancomycin6 /15  Zosyn 6/15  unasyn 6/14 till 6/15  Clindamycin 6/14 one dose  Flagyl 6/15  HPI/Subjective: He reports improvement to hand, thinks he is getting better. Tolerating PO intake.   Objective: Filed Vitals:   03/18/14 0545  BP: 144/83  Pulse: 46  Temp: 97.9 F (36.6 C)  Resp: 14    Intake/Output Summary (Last 24 hours) at 03/18/14 1312 Last data filed at 03/18/14 1000  Gross per 24 hour  Intake   2600 ml  Output   2475 ml  Net    125 ml   Filed Weights   03/12/14 1952  Weight: 68.04 kg (150 lb)    Exam:   General:  Alert  comfortable,   Cardiovascular: s1s2  Respiratory: ctab  Abdomen: soft NT ND BS+ Musculoskeletal: Multiple small 5 mm pustules spreading diffusely to the left lower thighs and leg. Swelling of the right hand a week fluctuation over the dorsum of the right hand with a small pus point without any discharge. Swelling of the right little finger with a small pus point as well . Warm and very tender to palpation. Tender to pressure over right of this joint.  Patient unable to fully flex his fingers . No needle marks seen .      Data Reviewed: Basic Metabolic Panel:  Recent Labs Lab 03/12/14 2050 03/13/14 0505 03/14/14 0450 03/16/14 0900 03/18/14 0916  NA 132* 140 138  --   --   K 3.3* 3.5* 3.6*  --   --   CL 96 101 102  --   --   CO2 23 27 27   --   --   GLUCOSE 87 100* 100*  --   --   BUN 8 9 7   --   --   CREATININE 0.63 0.72 0.57 0.70 0.73  CALCIUM 8.5 8.4 8.1*  --   --    Liver Function Tests:  Recent Labs Lab 03/12/14 2050  AST 50*  ALT 69*  ALKPHOS 70  BILITOT 0.5  PROT 7.0  ALBUMIN 3.0*   No results found for this basename: LIPASE, AMYLASE,  in the last 168 hours No results found for this basename: AMMONIA,  in the last 168 hours CBC:  Recent Labs Lab 03/12/14 2050 03/13/14 1230 03/14/14 0450 03/16/14 0504  WBC 15.5* 13.7* 13.8* 8.3  NEUTROABS 11.5*  --   --   --   HGB 12.6* 12.4* 12.1* 11.7*  HCT 37.6* 36.1* 36.6* 35.0*  MCV 92.4 91.2 93.1 93.1  PLT 303 297 339 340   Cardiac Enzymes: No results found for this basename: CKTOTAL, CKMB, CKMBINDEX, TROPONINI,  in the last 168 hours BNP (last 3 results) No results found for this basename: PROBNP,  in the last 8760 hours CBG: No results found for this basename: GLUCAP,  in the last 168 hours  Recent Results (from the past 240 hour(s))  CULTURE, BLOOD (ROUTINE X 2)     Status: None   Collection Time    03/12/14  8:50 PM      Result Value Ref Range Status   Specimen Description BLOOD RIGHT ANTECUBITAL   Final   Special Requests BOTTLES DRAWN AEROBIC AND ANAEROBIC 5CC   Final   Culture  Setup Time     Final   Value: 03/13/2014 00:45     Performed at [facility]   Culture     Final   Value:        BLOOD CULTURE RECEIVED NO GROWTH TO DATE CULTURE WILL BE HELD FOR 5 DAYS BEFORE ISSUING A FINAL NEGATIVE REPORT     Performed at [facility]   Report Status PENDING   Incomplete  CULTURE, BLOOD (ROUTINE X 2)     Status: None   Collection  Time    03/12/14  8:50 PM      Result Value Ref Range Status   Specimen Description BLOOD LEFT ANTECUBITAL   Final   Special Requests BOTTLES DRAWN AEROBIC AND ANAEROBIC 5CC   Final   Culture  Setup Time     Final   Value: 03/13/2014 00:45     Performed at [facility]   Culture     Final  Value:        BLOOD CULTURE RECEIVED NO GROWTH TO DATE CULTURE WILL BE HELD FOR 5 DAYS BEFORE ISSUING A FINAL NEGATIVE REPORT     [facility]   Report Status PENDING   Incomplete  CULTURE, ROUTINE-ABSCESS     Status: None   Collection Time    03/12/14 10:03 PM      Result Value Ref Range Status   Specimen Description WOUND BUTTOCKS   Final   Special Requests NONE   Final   Gram Stain     Final   Value: MODERATE WBC PRESENT, PREDOMINANTLY PMN     RARE SQUAMOUS EPITHELIAL CELLS PRESENT     MODERATE GRAM POSITIVE COCCI IN PAIRS     IN CLUSTERS     Performed at [facility]   Culture     Final   Value: MODERATE METHICILLIN RESISTANT STAPHYLOCOCCUS AUREUS     Note: RIFAMPIN AND GENTAMICIN SHOULD NOT BE USED AS SINGLE DRUGS FOR TREATMENT OF STAPH INFECTIONS. This organism DOES NOT demonstrate inducible Clindamycin resistance in vitro.     Performed at [facility]   Report Status 03/15/2014 FINAL   Final   Organism ID, Bacteria METHICILLIN RESISTANT STAPHYLOCOCCUS AUREUS   Final  GC/CHLAMYDIA PROBE AMP     Status: None   Collection Time    03/13/14 12:03 AM      Result Value Ref Range Status   CT Probe RNA NEGATIVE  NEGATIVE Final   GC Probe RNA NEGATIVE  NEGATIVE Final   Comment: (NOTE)                                                                                               **Normal Reference Range: Negative**          Assay performed using the Gen-Probe APTIMA COMBO2 (R) Assay.     Acceptable specimen types for this assay include APTIMA Swabs (Unisex,     endocervical, urethral, or vaginal), first void urine, and ThinPrep     liquid based  cytology samples.     Performed at [facility]  SURGICAL PCR SCREEN     Status: Abnormal   Collection Time    03/13/14  9:56 AM      Result Value Ref Range Status   MRSA, PCR POSITIVE (*) NEGATIVE Final   Comment: RESULT CALLED TO, READ BACK BY AND VERIFIED WITH:     RODRIGUEZ,A @ 1141 ON [id number] BY POTEAT,S   Staphylococcus aureus POSITIVE (*) NEGATIVE Final   Comment:            The Xpert SA Assay (FDA     approved for NASAL specimens     in patients over 41 years of age),     is one component of     a comprehensive surveillance     program.  Test performance has     been validated by [facility] for patients greater     than or equal to 41 year old.     It is not intended  to diagnose infection nor to     guide or monitor treatment.     Studies: No results found.  Scheduled Meds: . collagenase   Topical Daily  . enoxaparin (LOVENOX) injection  40 mg Subcutaneous QHS  . metronidazole  500 mg Intravenous 3 times per day  . nicotine  14 mg Transdermal Daily  . piperacillin-tazobactam (ZOSYN)  IV  3.375 g Intravenous Q8H  . potassium chloride  40 mEq Oral Once  . vancomycin  1,000 mg Intravenous Q8H   Continuous Infusions: . sodium chloride 75 mL/hr (03/18/14 1021)    Principal Problem:   Abscess, gluteal, left Active Problems:   Fever   Sepsis affecting skin   Hypokalemia   Cellulitis of right hand   Cellulitis of right little finger   Abscess of buttock, left    Time spent: 25 minutes.     [facility] Medical CenterERNANDEZ ACOSTA,ESTELA  [facility] Pager [phone].  If 7PM-7AM, please contact night-coverage at [email or web address], password [email or web address] 03/18/2014, 1:12 PM  LOS: 6 days

## 2014-03-18 NOTE — Progress Notes (Signed)
ANTIBIOTIC CONSULT NOTE   Pharmacy Consult for Vancomycin/Zosyn Indication: Left gluteal abscess with sepsis/ R hand cellulitis/abscess    No Known Allergies  Patient Measurements: Height: 5\' 11"  (180.3 cm) Weight: 150 lb (68.04 kg) IBW/kg (Calculated) : 75.3   Vital Signs: Temp: 97.9 F (36.6 C) (06/20 0545) Temp src: Oral (06/20 0545) BP: 144/83 mmHg (06/20 0545) Pulse Rate: 46 (06/20 0545) Intake/Output from previous day: 06/19 0701 - 06/20 0700 In: 3853.8 [P.O.:830; I.V.:1573.8; IV Piggyback:1450] Out: 2675 [Urine:2675] Intake/Output from this shift:    Labs:  Recent Labs  03/16/14 0504 03/16/14 0900 03/18/14 0916  WBC 8.3  --   --   HGB 11.7*  --   --   PLT 340  --   --   CREATININE  --  0.70 0.73   Estimated Creatinine Clearance: 116.9 ml/min (by C-G formula based on Cr of 0.73).  Recent Labs  03/16/14 1643 03/18/14 0916  VANCOTROUGH 13.6 26.0*     Assessment: 43 yo M with hx IV drug use in past 3 months, admitted with left gluteal abscess / right hand cellulitis/abscess  with sepsis.  Concern noted for possible staph bacteremia / endocarditis although blood cultures are negative to date. Vancomycin and Zosyn were started with pharmacy dosing assistance requested.   MD also added Flagyl IV.  6/15>>clinda x1 dose in ED>>6/15 6/15>> Unasyn >> 6/15 6/15>> Vanc >> 6/15>>Flagyl >> 6/15 >> Zosyn >>   Tmax: AF WBCs: Leukocytosis resolved Renal: SCr 0.7 stable 6/18, CrCl > 100 N/CG  Cultures: 6/14 Buttock wounds: MRSA 6/14 Blood x 2: NGtd 6/15 GC/Chlamydia probe: both negative  6/20:  D#6 Vancomycin 1500 mg IV q8h (dose increased on 6/18 for low trough) / Zosyn 3.375 grams IV q8h (extended-infusion) / Flagyl 500 mg IV q8h [per MD])  MRSA in buttock abscess culture noted.  Blood cultures remain negative to date.  2D Echo: no vegetations seen.  TEE reportedly being considered.  Serum creatinine stable.  Vancomycin trough today reported as  supratherapeutic.  Goal of therapy: Appropriate antibiotic dosing for renal function; eradication of infection. Vancomycin trough 15-20 until bacteremia/endocarditis ruled out  Plan: 1. Reduce vancomycin to 1000 mg IV q8h.  Will repeat trough level at steady state.  Patient had subtherapeutic trough previously on this dose but appears to be accumulating.   2. Continue Zosyn 3.375 grams IV q8h (extended-infusion) 3. Recommend considering discontinuation of Flagyl since Zosyn provides anaerobic coverage - decision per MD 4. Await word on planned duration of therapy   Clance BollAmanda Runyon, PharmD, BCPS Pager: [phone] 03/18/2014 10:25 AM

## 2014-03-19 LAB — CULTURE, BLOOD (ROUTINE X 2)
CULTURE: NO GROWTH
Culture: NO GROWTH

## 2014-03-19 NOTE — Progress Notes (Signed)
Patient ID: Joseph Spence, male   DOB: 1970-10-17, 43 y.o.   MRN: [id number] [facility] Surgery Progress Note:   6 Days Post-Op  Subjective: Mental status is clear.  He gets up and walks about periodically Objective: Vital signs in last 24 hours: Temp:  [97.4 F (36.3 C)-98.3 F (36.8 C)] 97.4 F (36.3 C) (06/21 0614) Pulse Rate:  [51-66] 58 (06/21 0614) Resp:  [14-18] 14 (06/21 0614) BP: (144-156)/(89-97) 144/89 mmHg (06/21 0614) SpO2:  [96 %-98 %] 97 % (06/21 0614)  Intake/Output from previous day: 06/20 0701 - 06/21 0700 In: 2000 [I.V.:1800; IV Piggyback:200] Out: 3850 [Urine:3850] Intake/Output this shift:    Physical Exam: Work of breathing is normal.  Hand is slowly improving.  Buttock is about 1.5 cm in diameter with induration but no redness or purulence.  Redressed.    Lab Results:  Results for orders placed during the hospital encounter of 03/12/14 (from the past 48 hour(s))  VANCOMYCIN, TROUGH     Status: Abnormal   Collection Time    03/18/14  9:16 AM      Result Value Ref Range   Vancomycin Tr 26.0 (*) 10.0 - 20.0 ug/mL   Comment: CRITICAL RESULT CALLED TO, READ BACK BY AND VERIFIED WITH:     CORBETT,P AT 1015 ON [id number] BY HOOKER,B  CREATININE, SERUM     Status: None   Collection Time    03/18/14  9:16 AM      Result Value Ref Range   Creatinine, Ser 0.73  0.50 - 1.35 mg/dL   GFR calc non Af Amer >90  >90 mL/min   GFR calc Af Amer >90  >90 mL/min   Comment: (NOTE)     The eGFR has been calculated using the CKD EPI equation.     This calculation has not been validated in all clinical situations.     eGFR's persistently <90 mL/min signify possible Chronic Kidney     Disease.    Radiology/Results: No results found.  Anti-infectives: Anti-infectives   Start     Dose/Rate Route Frequency Ordered Stop   03/18/14 1400  vancomycin (VANCOCIN) IVPB 1000 mg/200 mL premix     1,000 mg 200 mL/hr over 60 Minutes Intravenous Every 8 hours 03/18/14 1035      03/17/14 0200  vancomycin (VANCOCIN) 1,500 mg in sodium chloride 0.9 % 500 mL IVPB  Status:  Discontinued     1,500 mg 250 mL/hr over 120 Minutes Intravenous Every 8 hours 03/16/14 1757 03/18/14 1018   03/14/14 1800  vancomycin (VANCOCIN) 1,250 mg in sodium chloride 0.9 % 250 mL IVPB  Status:  Discontinued     1,250 mg 166.7 mL/hr over 90 Minutes Intravenous Every 8 hours 03/14/14 0942 03/16/14 1757   03/14/14 1000  vancomycin (VANCOCIN) 1,250 mg in sodium chloride 0.9 % 250 mL IVPB  Status:  Discontinued     1,250 mg 166.7 mL/hr over 90 Minutes Intravenous Every 8 hours 03/14/14 0907 03/14/14 0942   03/13/14 2000  piperacillin-tazobactam (ZOSYN) IVPB 3.375 g     3.375 g 12.5 mL/hr over 240 Minutes Intravenous Every 8 hours 03/13/14 1835     03/13/14 0045  metroNIDAZOLE (FLAGYL) IVPB 500 mg     500 mg 100 mL/hr over 60 Minutes Intravenous 3 times per day 03/13/14 0030     03/13/14 0000  vancomycin (VANCOCIN) IVPB 1000 mg/200 mL premix  Status:  Discontinued     1,000 mg 200 mL/hr over 60 Minutes Intravenous Every 8 hours  03/12/14 2351 03/14/14 0906   03/13/14 0000  Ampicillin-Sulbactam (UNASYN) 3 g in sodium chloride 0.9 % 100 mL IVPB  Status:  Discontinued     3 g 100 mL/hr over 60 Minutes Intravenous Every 6 hours 03/12/14 2354 03/13/14 1825   03/12/14 2045  clindamycin (CLEOCIN) IVPB 600 mg     600 mg 100 mL/hr over 30 Minutes Intravenous  Once 03/12/14 2032 03/12/14 2200      Assessment/Plan: Problem List: Patient Active Problem List   Diagnosis Date Noted  . Abscess of buttock, left 03/13/2014  . Fever 03/12/2014  . Abscess, gluteal, left 03/12/2014  . Sepsis affecting skin 03/12/2014  . Hypokalemia 03/12/2014  . Cellulitis of right hand 03/12/2014  . Cellulitis of right little finger 03/12/2014    May be able to transfer to home care soon.  Infections are under control.   6 Days Post-Op    LOS: 7 days   Matt B. Hassell Done, MD, [facility] [phone] beeper [phone]  03/19/2014 9:23 AM

## 2014-03-19 NOTE — Progress Notes (Signed)
[facility] PROGRESS NOTE  Joseph Spence ZOX:[id number] DOB: 1971/06/30 DOA: 03/12/2014 PCP: No primary provider on file. 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 He was discharged home on Keflex and Bactrim. Patient reports that 9 days back he had gone camping and shortly after returning from there he noted some erythema and small pustules over the left gluteal area. 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 For past 2 days he also noticed it edema and swelling over his right hand that started over the little finger and extended over the dorsum of his hand. He was unable to flex his fingers and the site became more painful. He reports seeing pustules for the ast 2 days. In ED, he was febrile and he underwent blood cultures and I&D of the left gluteal abscess. He was started on IV antibiotics. Surgery and orthopedics consulted. Dr Mina Marble suggested no need for I&d of the right hand finger. Surgery recommended continueing the antibiotics and hydrotherapy. Culture reports from the wound show moderate staph aureus and he is on IV vancomycin and IV zosyn. Blood cultures are negative so far. Echocardiogram ordered in view of the involvement of the multiple skin areas .    Assessment/Plan:  Left gluteal MRSA abscess given disseminated areas of pustules and involvement of multiple skin areas with abscesses involving buttock and right hand with hx of IV drug use, staph bacteremia is of concern (however, BC remain negative to date) , echocardiogram without evidence for valvular vegetation..  -General surgery following -Wound Culture growing MRSA, organism susceptible to  Vancomycin. Blood cultures from 03/12/2014 are negative to date -White count trending down to 8.3 from 13.8 -Wound care as per surgical recommendations (continue hydrotherapy for now).    Cellulitis with possible abscess over right hand and little finger  Improved, no surgical interventions recommended. Seen by Dr. Mina Marble  Hypokalemia:  Repleted  Tobacco abuse - nicotine patch ordered.   Drug abuse - counseling given.   Leukocytosis: Resolved. -2/2 gluteal abscess    Anemia- normocytic. Appears to be anemia of chronic disease per anemia panel.   Hepatitis C antibody positive.  Elevated liver function tests - h/o drug abuse  DVT prophylaxis.         Code Status: full code Family Communication:Patient only  Disposition Plan remain inpatient.  DC to be determined by surgery.   Consultants:  Surgery  Hand surgery- orthpedics.   Procedures:  I&D on admission.   Antibiotics:  Vancomycin6 /15  Zosyn 6/15  unasyn 6/14 till 6/15  Clindamycin 6/14 one dose  Flagyl 6/15  HPI/Subjective: He reports improvement to hand, thinks he is getting better. Tolerating PO intake.   Objective: Filed Vitals:   03/19/14 0614  BP: 144/89  Pulse: 58  Temp: 97.4 F (36.3 C)  Resp: 14    Intake/Output Summary (Last 24 hours) at 03/19/14 1358 Last data filed at 03/19/14 1000  Gross per 24 hour  Intake   2000 ml  Output   3900 ml  Net  -1900 ml   Filed Weights   03/12/14 1952  Weight: 68.04 kg (150 lb)    Exam:  General:  Alert comfortable,   Cardiovascular: s1s2  Respiratory: ctab  Abdomen: soft NT ND BS+ Musculoskeletal: Multiple small 5 mm pustules spreading diffusely to the left lower thighs and leg. Swelling of the right hand a week fluctuation over the dorsum of the right hand with a small pus point without any discharge. Swelling of the right little finger with a small pus point as well . Warm and very tender to palpation. Tender to pressure  over right of this joint. Patient unable to fully flex his fingers . No needle marks seen .      Data Reviewed: Basic Metabolic Panel:  Recent Labs Lab 03/12/14 2050 03/13/14 0505 03/14/14 0450 03/16/14 0900 03/18/14 0916  NA 132* 140 138  --   --   K 3.3* 3.5* 3.6*  --   --   CL 96 101 102  --   --   CO2 23 27 27   --   --   GLUCOSE 87 100* 100*  --   --   BUN 8 9 7   --   --   CREATININE 0.63 0.72 0.57 0.70 0.73  CALCIUM 8.5 8.4 8.1*  --   --    Liver Function Tests:  Recent Labs Lab 03/12/14 2050  AST 50*  ALT 69*  ALKPHOS 70  BILITOT 0.5  PROT 7.0  ALBUMIN 3.0*   No results found for this basename: LIPASE, AMYLASE,  in the last 168 hours No results found for this basename: AMMONIA,  in the last 168 hours CBC:  Recent Labs Lab 03/12/14 2050 03/13/14 1230 03/14/14 0450 03/16/14 0504  WBC 15.5* 13.7* 13.8* 8.3  NEUTROABS 11.5*  --   --   --   HGB 12.6* 12.4* 12.1* 11.7*  HCT 37.6* 36.1* 36.6* 35.0*  MCV 92.4 91.2 93.1 93.1  PLT 303 297 339 340   Cardiac Enzymes: No results found for this basename: CKTOTAL, CKMB, CKMBINDEX, TROPONINI,  in the last 168 hours BNP (last 3 results) No results found for this basename: PROBNP,  in the last 8760 hours CBG: No results found for this basename: GLUCAP,  in the last 168 hours  Recent Results (from the past 240 hour(s))  CULTURE, BLOOD (ROUTINE X 2)     Status: None   Collection Time    03/12/14  8:50 PM      Result Value Ref Range Status   Specimen Description BLOOD RIGHT ANTECUBITAL   Final   Special Requests BOTTLES DRAWN AEROBIC AND ANAEROBIC 5CC   Final   Culture  Setup Time     Final   Value: 03/13/2014 00:45     Performed at [facility]   Culture     Final   Value: NO GROWTH 5 DAYS     Performed at [facility]   Report Status 03/19/2014 FINAL   Final  CULTURE, BLOOD (ROUTINE X 2)     Status: None   Collection Time    03/12/14  8:50 PM      Result Value Ref Range Status    Specimen Description BLOOD LEFT ANTECUBITAL   Final   Special Requests BOTTLES DRAWN AEROBIC AND ANAEROBIC 5CC   Final   Culture  Setup Time     Final   Value: 03/13/2014 00:45     Performed at [facility]   Culture     Final   Value: NO GROWTH 5 DAYS     Performed at [facility]   Report  Status 03/19/2014 FINAL   Final  CULTURE, ROUTINE-ABSCESS     Status: None   Collection Time    03/12/14 10:03 PM      Result Value Ref Range Status   Specimen Description WOUND BUTTOCKS   Final   Special Requests NONE   Final   Gram Stain     Final   Value: MODERATE WBC PRESENT, PREDOMINANTLY PMN     RARE SQUAMOUS EPITHELIAL CELLS PRESENT     MODERATE GRAM POSITIVE COCCI IN PAIRS     IN CLUSTERS     Performed at [facility]   Culture     Final   Value: MODERATE METHICILLIN RESISTANT STAPHYLOCOCCUS AUREUS     Note: RIFAMPIN AND GENTAMICIN SHOULD NOT BE USED AS SINGLE DRUGS FOR TREATMENT OF STAPH INFECTIONS. This organism DOES NOT demonstrate inducible Clindamycin resistance in vitro.     Performed at [facility]   Report Status 03/15/2014 FINAL   Final   Organism ID, Bacteria METHICILLIN RESISTANT STAPHYLOCOCCUS AUREUS   Final  GC/CHLAMYDIA PROBE AMP     Status: None   Collection Time    03/13/14 12:03 AM      Result Value Ref Range Status   CT Probe RNA NEGATIVE  NEGATIVE Final   GC Probe RNA NEGATIVE  NEGATIVE Final   Comment: (NOTE)                                                                                               **Normal Reference Range: Negative**          Assay performed using the Gen-Probe APTIMA COMBO2 (R) Assay.     Acceptable specimen types for this assay include APTIMA Swabs (Unisex,     endocervical, urethral, or vaginal), first void urine, and ThinPrep     liquid based cytology samples.     Performed at [facility]  SURGICAL PCR SCREEN     Status: Abnormal   Collection Time    03/13/14  9:56 AM      Result Value Ref  Range Status   MRSA, PCR POSITIVE (*) NEGATIVE Final   Comment: RESULT CALLED TO, READ BACK BY AND VERIFIED WITH:     RODRIGUEZ,A @ 1141 ON [id number] BY POTEAT,S   Staphylococcus aureus POSITIVE (*) NEGATIVE Final   Comment:            The Xpert SA Assay (FDA     approved for NASAL specimens     in patients over 41 years of age),     is one component of     a comprehensive surveillance     program.  Test performance has     been validated by [facility] for patients greater     than or equal to 41 year old.     It is not intended     to diagnose infection nor to     guide or monitor treatment.     Studies: No results found.  Scheduled Meds: . collagenase   Topical Daily  . enoxaparin (LOVENOX) injection  40  mg Subcutaneous QHS  . metronidazole  500 mg Intravenous 3 times per day  . nicotine  14 mg Transdermal Daily  . piperacillin-tazobactam (ZOSYN)  IV  3.375 g Intravenous Q8H  . potassium chloride  40 mEq Oral Once  . vancomycin  1,000 mg Intravenous Q8H   Continuous Infusions: . sodium chloride 75 mL/hr at 03/19/14 1610    Principal Problem:   Abscess, gluteal, left Active Problems:   Fever   Sepsis affecting skin   Hypokalemia   Cellulitis of right hand   Cellulitis of right little finger   Abscess of buttock, left    Time spent: 25 minutes.     [facility]  [facility] Pager [phone].  If 7PM-7AM, please contact night-coverage at [email or web address], password [facility] 03/19/2014, 1:58 PM  LOS: 7 days

## 2014-03-20 LAB — CBC
HCT: 39.6 % (ref 39.0–52.0)
Hemoglobin: 13.5 g/dL (ref 13.0–17.0)
MCH: 31.5 pg (ref 26.0–34.0)
MCHC: 34.1 g/dL (ref 30.0–36.0)
MCV: 92.5 fL (ref 78.0–100.0)
Platelets: 388 10*3/uL (ref 150–400)
RBC: 4.28 MIL/uL (ref 4.22–5.81)
RDW: 13.6 % (ref 11.5–15.5)
WBC: 12.6 10*3/uL — ABNORMAL HIGH (ref 4.0–10.5)

## 2014-03-20 LAB — COMPREHENSIVE METABOLIC PANEL
ALBUMIN: 2.8 g/dL — AB (ref 3.5–5.2)
ALK PHOS: 99 U/L (ref 39–117)
ALT: 28 U/L (ref 0–53)
AST: 18 U/L (ref 0–37)
BUN: 6 mg/dL (ref 6–23)
CO2: 25 mEq/L (ref 19–32)
Calcium: 8.6 mg/dL (ref 8.4–10.5)
Chloride: 105 mEq/L (ref 96–112)
Creatinine, Ser: 0.72 mg/dL (ref 0.50–1.35)
GFR calc Af Amer: >90 mL/min (ref >90)
GFR calc non Af Amer: >90 mL/min (ref >90)
Glucose, Bld: 103 mg/dL — ABNORMAL HIGH (ref 70–99)
Potassium: 3.9 mEq/L (ref 3.7–5.3)
SODIUM: 142 meq/L (ref 137–147)
Total Bilirubin: <0.2 mg/dL — ABNORMAL LOW (ref 0.3–1.2)
Total Protein: 6.9 g/dL (ref 6.0–8.3)

## 2014-03-20 MED ORDER — OXYCODONE HCL 5 MG PO TABS: 5.0000 mg | ORAL_TABLET | ORAL | Status: DC | PRN

## 2014-03-20 MED ORDER — DOXYCYCLINE HYCLATE 100 MG PO TABS: 100.0000 mg | ORAL_TABLET | Freq: Two times a day (BID) | ORAL | Status: DC

## 2014-03-20 NOTE — Discharge Instructions (Signed)
Dressing Change twice a day A dressing is a material placed over wounds. It keeps the wound clean, dry, and protected from further injury. This provides an environment that favors wound healing.  BEFORE YOU BEGIN  Get your supplies together. Things you may need include:  Saline solution.  Flexible gauze dressing.  Medicated cream.  Tape.  Gloves.  Abdominal dressing pads.  Gauze squares.  Plastic bags.  Take pain medicine 30 minutes before the dressing change if you need it.  Take a shower before you do the first dressing change of the day. Use plastic wrap or a plastic bag to prevent the dressing from getting wet. REMOVING YOUR OLD DRESSING   Wash your hands with soap and water. Dry your hands with a clean towel.  Put on your gloves.  Remove any tape.  Carefully remove the old dressing. If the dressing sticks, you may dampen it with warm water to loosen it, or follow your caregiver's specific directions.  Remove any gauze or packing tape that is in your wound.  Take off your gloves.  Put the gloves, tape, gauze, or any packing tape into a plastic bag. CHANGING YOUR DRESSING  Open the supplies.  Take the cap off the saline solution.  Open the gauze package so that the gauze remains on the inside of the package.  Put on your gloves.  Clean your wound as told by your caregiver.  If you have been told to keep your wound dry, follow those instructions.  Your caregiver may tell you to do one or more of the following:  Pick up the gauze. Pour the saline solution over the gauze. Squeeze out the extra saline solution.  Put medicated cream or other medicine on your wound if you have been told to do so.  Put the solution soaked gauze only in your wound, not on the skin around it.  Pack your wound loosely or as told by your caregiver.  Put dry gauze on your wound.  Put abdominal dressing pads over the dry gauze if your wet gauze soaks through.  Tape the  abdominal dressing pads in place so they will not fall off. Do not wrap the tape completely around the affected part (arm, leg, abdomen).  Wrap the dressing pads with a flexible gauze dressing to secure it in place.  Take off your gloves. Put them in the plastic bag with the old dressing. Tie the bag shut and throw it away.  Keep the dressing clean and dry until your next dressing change.  Wash your hands. SEEK MEDICAL CARE IF:  Your skin around the wound looks red.  Your wound feels more tender or sore.  You see pus in the wound.  Your wound smells bad.  You have a fever.  Your skin around the wound has a rash that itches and burns.  You see black or yellow skin in your wound that was not there before.  You feel nauseous, throw up, and feel very tired. Document Released: 10/23/2004 Document Revised: 12/08/2011 Document Reviewed: 07/28/2011 [facility] Patient Information 2015 [place] [place]. This information is not intended to replace advice given to you by your health care provider. Make sure you discuss any questions you have with your health care provider.

## 2014-03-20 NOTE — Progress Notes (Signed)
Patient ID: Joseph Spence, male   DOB: 17-Oct-1970, 43 y.o.   MRN: [id number] 7 Days Post-Op  Subjective: Pt without new c/o.  Objective: Vital signs in last 24 hours: Temp:  [97.6 F (36.4 C)-98.4 F (36.9 C)] 97.6 F (36.4 C) (06/22 0544) Pulse Rate:  [54-74] 54 (06/22 0544) Resp:  [18] 18 (06/22 0544) BP: (120-129)/(69-86) 129/86 mmHg (06/22 0544) SpO2:  [96 %-99 %] 97 % (06/22 0544) Last BM Date: 03/19/14  Intake/Output from previous day: 06/21 0701 - 06/22 0700 In: 3292.5 [P.O.:360; I.V.:1182.5; IV Piggyback:1750] Out: 3800 [Urine:3800] Intake/Output this shift:    PE: Skin: buttock wound is almost 100% clean.  No further infection.  Lab Results:   Recent Labs  03/20/14 0508  WBC 12.6*  HGB 13.5  HCT 39.6  PLT 388   BMET  Recent Labs  03/18/14 0916 03/20/14 0508  NA  --  142  K  --  3.9  CL  --  105  CO2  --  25  GLUCOSE  --  103*  BUN  --  6  CREATININE 0.73 0.72  CALCIUM  --  8.6   PT/INR No results found for this basename: LABPROT, INR,  in the last 72 hours CMP     Component Value Date/Time   NA 142 03/20/2014 0508   K 3.9 03/20/2014 0508   CL 105 03/20/2014 0508   CO2 25 03/20/2014 0508   GLUCOSE 103* 03/20/2014 0508   BUN 6 03/20/2014 0508   CREATININE 0.72 03/20/2014 0508   CALCIUM 8.6 03/20/2014 0508   PROT 6.9 03/20/2014 0508   ALBUMIN 2.8* 03/20/2014 0508   AST 18 03/20/2014 0508   ALT 28 03/20/2014 0508   ALKPHOS 99 03/20/2014 0508   BILITOT <0.2* 03/20/2014 0508   GFRNONAA >90 03/20/2014 0508   GFRAA >90 03/20/2014 0508   Lipase  No results found for this basename: lipase       Studies/Results: No results found.  Anti-infectives: Anti-infectives   Start     Dose/Rate Route Frequency Ordered Stop   03/18/14 1400  vancomycin (VANCOCIN) IVPB 1000 mg/200 mL premix     1,000 mg 200 mL/hr over 60 Minutes Intravenous Every 8 hours 03/18/14 1035     03/17/14 0200  vancomycin (VANCOCIN) 1,500 mg in sodium chloride 0.9 % 500 mL IVPB   Status:  Discontinued     1,500 mg 250 mL/hr over 120 Minutes Intravenous Every 8 hours 03/16/14 1757 03/18/14 1018   03/14/14 1800  vancomycin (VANCOCIN) 1,250 mg in sodium chloride 0.9 % 250 mL IVPB  Status:  Discontinued     1,250 mg 166.7 mL/hr over 90 Minutes Intravenous Every 8 hours 03/14/14 0942 03/16/14 1757   03/14/14 1000  vancomycin (VANCOCIN) 1,250 mg in sodium chloride 0.9 % 250 mL IVPB  Status:  Discontinued     1,250 mg 166.7 mL/hr over 90 Minutes Intravenous Every 8 hours 03/14/14 0907 03/14/14 0942   03/13/14 2000  piperacillin-tazobactam (ZOSYN) IVPB 3.375 g     3.375 g 12.5 mL/hr over 240 Minutes Intravenous Every 8 hours 03/13/14 1835     03/13/14 0045  metroNIDAZOLE (FLAGYL) IVPB 500 mg     500 mg 100 mL/hr over 60 Minutes Intravenous 3 times per day 03/13/14 0030     03/13/14 0000  vancomycin (VANCOCIN) IVPB 1000 mg/200 mL premix  Status:  Discontinued     1,000 mg 200 mL/hr over 60 Minutes Intravenous Every 8 hours 03/12/14 2351 03/14/14 0906  03/13/14 0000  Ampicillin-Sulbactam (UNASYN) 3 g in sodium chloride 0.9 % 100 mL IVPB  Status:  Discontinued     3 g 100 mL/hr over 60 Minutes Intravenous Every 6 hours 03/12/14 2354 03/13/14 1825   03/12/14 2045  clindamycin (CLEOCIN) IVPB 600 mg     600 mg 100 mL/hr over 30 Minutes Intravenous  Once 03/12/14 2032 03/12/14 2200       Assessment/Plan  1. MRSA abscesses 2. Buttock wound  Plan: 1. Wound on buttock is much cleaner.  He no longer needs hydrotherapy or santyl.  I have discontinued both of these.  He is stable for dc home from our standpoint with oral abx therapy. 2. He may follow up with the wound care clinic. 3. We will sign off.  Please call as needed  LOS: 8 days    OSBORNE,KELLY E 03/20/2014, 8:06 AM Pager: [phone]

## 2014-03-20 NOTE — Discharge Summary (Signed)
Physician Discharge Summary  Joseph Spence:[id number] DOB: Jun 07, 1971 DOA: 03/12/2014  PCP: No primary Jandi Swiger on file.  Admit date: 03/12/2014 Discharge date: 03/20/2014  Time spent: 45 minutes  Recommendations for Outpatient Follow-up:  -Will be discharged home today. -Advised to follow up with [facility] wound clinic in 2 weeks.   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 He was discharged home on Keflex and Bactrim. Patient reports that 9 days back he had gone camping and shortly after returning from there he noted some erythema and small pustules over the left gluteal area. 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 For past 2 days he also noticed it edema and swelling over his right hand that started over the little finger and extended over the dorsum of his hand. He was unable to flex his fingers and the site became more painful. He also noticed audible he has a redness over his legs forming small pustules for past 2 days. He denies any fever or chills. He denies any sick contacts. Denies any dysuria or urethral discharge. Denies any nausea vomiting or diarrhea. He reports having a monogamous relationship with his wife and being tested for HIV recently. Patient denies headache, dizziness, fever, chills, nausea , vomiting, chest pain,  palpitations, SOB, abdominal pain, bowel or urinary symptoms. Denies change in weight or appetite.  Course in the ED  Patient was febrile to 100.9 degree Fahrenheit. Remaining vitals were stable. Blood work done showed WBC of 15.5k, hemoglobin of 12.6, hematocrit of 37.6 and platelets of 303. Chemistry showed sodium of 132, potassium of 3.3 with normal renal function.  -Left gluteal abscess was I&D by ED physician and wound culture sent. Blood culture was sent and patient given a dose of IV clindamycin. Hospital was called for admission to medical floor.   Hospital Course:   Left gluteal MRSA abscess  given disseminated areas of pustules and involvement of multiple skin areas with abscesses involving buttock and right hand with hx of IV drug use, staph bacteremia is of concern (however, BC remain negative to date) , echocardiogram without evidence for valvular vegetation..  -General surgery following  -Wound Culture growing MRSA, organism susceptible to multiple PO agents. Will DC on doxy for 14 days. Blood cultures from 03/12/2014 are negative to date  -White count trending down to 8.3 from 13.8  -Received wound care directed by general surgery in the form of hydrotherapy while in the hospital.  Cellulitis with possible abscess over right hand and little finger  Improved, no surgical interventions recommended. Seen by Dr. Mina Marble   Hypokalemia:  Repleted   Tobacco abuse  - nicotine patch ordered.   Drug abuse  - counseling given.   Leukocytosis:  Resolved.  -2/2 gluteal abscess  Anemia- normocytic. Appears to be anemia of chronic disease per anemia panel.   Hepatitis C antibody positive.  Elevated liver function tests resolved. - h/o drug abuse   Procedures:  Hydrotherapy to left gluteal abscess   Consultations:  General Surgery  Discharge Instructions  Discharge Instructions   Discontinue IV    Complete by:  As directed      Increase activity slowly    Complete  by:  As directed             Medication List    STOP taking these medications       cephALEXin 500 MG capsule  Commonly known as:  KEFLEX     oxyCODONE-acetaminophen 5-325 MG per tablet  Commonly known as:  PERCOCET/ROXICET     sulfamethoxazole-trimethoprim 800-160 MG per tablet  Commonly known as:  SEPTRA DS      TAKE these medications       doxycycline 100 MG tablet  Commonly known as:  VIBRA-TABS  Take 1 tablet (100 mg total) by mouth 2 (two) times daily. For 14 days     oxyCODONE 5 MG immediate release tablet  Commonly known as:  Oxy IR/ROXICODONE  Take 1 tablet (5 mg total) by mouth every 4 (four) hours as needed for severe pain.       No Known Allergies     Follow-up Information   Follow up with [facility]              In 2 weeks.   Contact information:   [address] [place] [phone]       The results of significant diagnostics from this hospitalization (including imaging, microbiology, ancillary and laboratory) are listed below for reference.    Significant Diagnostic Studies: No results found.  Microbiology: Recent Results (from the past 240 hour(s))  CULTURE, BLOOD (ROUTINE X 2)     Status: None   Collection Time    03/12/14  8:50 PM      Result Value Ref Range Status   Specimen Description BLOOD RIGHT ANTECUBITAL   Final   Special Requests BOTTLES DRAWN AEROBIC AND ANAEROBIC 5CC   Final   Culture  Setup Time     Final   Value: 03/13/2014 00:45     Performed at [facility]   Culture     Final   Value: NO GROWTH 5 DAYS     Performed at [facility]   Report Status 03/19/2014 FINAL   Final  CULTURE, BLOOD (ROUTINE X 2)     Status: None   Collection Time    03/12/14  8:50 PM      Result Value Ref Range Status   Specimen Description BLOOD LEFT ANTECUBITAL   Final   Special Requests BOTTLES DRAWN AEROBIC AND ANAEROBIC 5CC   Final   Culture  Setup Time      Final   Value: 03/13/2014 00:45     Performed at [facility]   Culture     Final   Value: NO GROWTH 5 DAYS     Performed at [facility]   Report Status 03/19/2014 FINAL   Final  CULTURE, ROUTINE-ABSCESS     Status: None   Collection Time    03/12/14 10:03 PM      Result Value Ref Range Status   Specimen Description WOUND BUTTOCKS   Final   Special Requests NONE   Final   Gram  Stain     Final   Value: MODERATE WBC PRESENT, PREDOMINANTLY PMN     RARE SQUAMOUS EPITHELIAL CELLS PRESENT     MODERATE GRAM POSITIVE COCCI IN PAIRS     IN CLUSTERS     Performed at [facility]   Culture     Final   Value: MODERATE METHICILLIN RESISTANT STAPHYLOCOCCUS AUREUS     Note: RIFAMPIN AND GENTAMICIN SHOULD NOT BE USED AS SINGLE DRUGS FOR TREATMENT OF STAPH INFECTIONS. This organism DOES NOT demonstrate inducible Clindamycin resistance in vitro.     Performed at [facility]   Report Status 03/15/2014 FINAL   Final   Organism ID, Bacteria METHICILLIN RESISTANT STAPHYLOCOCCUS AUREUS   Final  GC/CHLAMYDIA PROBE AMP     Status: None   Collection Time    03/13/14 12:03 AM      Result Value Ref Range Status   CT Probe RNA NEGATIVE  NEGATIVE Final   GC Probe RNA NEGATIVE  NEGATIVE Final   Comment: (NOTE)                                                                                               **Normal Reference Range: Negative**          Assay performed using the Gen-Probe APTIMA COMBO2 (R) Assay.     Acceptable specimen types for this assay include APTIMA Swabs (Unisex,     endocervical, urethral, or vaginal), first void urine, and ThinPrep     liquid based cytology samples.     Performed at [facility]  SURGICAL PCR SCREEN     Status: Abnormal   Collection Time    03/13/14  9:56 AM      Result Value Ref Range Status   MRSA, PCR POSITIVE (*) NEGATIVE Final   Comment: RESULT CALLED TO, READ BACK BY AND VERIFIED WITH:     RODRIGUEZ,A @ 1141 ON [id number]  BY POTEAT,S   Staphylococcus aureus POSITIVE (*) NEGATIVE Final   Comment:            The Xpert SA Assay (FDA     approved for NASAL specimens     in patients over 41 years of age),     is one component of     a comprehensive surveillance     program.  Test performance has     been validated by [facility] for patients greater     than or equal to 464 year old.     It is not intended     to diagnose infection nor to     guide or monitor treatment.     Labs: Basic Metabolic Panel:  Recent Labs Lab 03/14/14 0450 03/16/14 0900 03/18/14 0916 03/20/14 0508  NA 138  --   --  142  K 3.6*  --   --  3.9  CL 102  --   --  105  CO2 27  --   --  25  GLUCOSE 100*  --   --  103*  BUN 7  --   --  6  CREATININE 0.57 0.70 0.73 0.72  CALCIUM 8.1*  --   --  8.6   Liver Function Tests:  Recent Labs Lab 03/20/14 0508  AST 18  ALT 28  ALKPHOS 99  BILITOT <0.2*  PROT 6.9  ALBUMIN 2.8*   No results found for this basename: LIPASE, AMYLASE,  in the last 168 hours No results found for this basename: AMMONIA,  in the last 168 hours CBC:  Recent Labs Lab 03/14/14 0450 03/16/14 0504 03/20/14 0508  WBC 13.8* 8.3 12.6*  HGB 12.1* 11.7* 13.5  HCT 36.6* 35.0* 39.6  MCV 93.1 93.1 92.5  PLT 339 340 388   Cardiac Enzymes: No results found for this basename: CKTOTAL, CKMB, CKMBINDEX, TROPONINI,  in the last 168 hours BNP: BNP (last 3 results) No results found for this basename: PROBNP,  in the last 8760 hours CBG: No results found for this basename: GLUCAP,  in the last 168 hours     Signed:  Chaya Jan  [facility] Pager: [phone] 03/20/2014, 2:23 PM

## 2014-03-20 NOTE — Progress Notes (Signed)
Physical Therapy Discharge Patient Details Name: Joseph Spence MRN: [id number] DOB: 03/06/1971 Today's Date: 03/20/2014 Time:  -     Patient discharged from PT services secondary to MD has discontinued due to wound has improved. Please see latest therapy progress note for current level of functioning and progress toward goals.      GP     Sharen HeckHill, Karen Elizabeth Karen Hill PT [phone]  03/20/2014, 9:01 AM

## 2014-03-20 NOTE — Progress Notes (Signed)
General surgery attending:  I personally interviewed and examined this patient this morning. I looked at his wounds. I agree with the assessment and treatment plan as outlined by Ms. Earl Galasborne, [place]. Please reconsult if needed.  Angelia MouldHaywood M. Derrell LollingIngram, M.D., [facility] General and Minimally invasive Surgery Breast and Colorectal Surgery Office:   [phone]

## 2014-03-28 ENCOUNTER — Encounter (HOSPITAL_COMMUNITY): Payer: Self-pay | Admitting: Emergency Medicine

## 2014-03-28 ENCOUNTER — Emergency Department (HOSPITAL_COMMUNITY)
Admission: EM | Admit: 2014-03-28 | Discharge: 2014-03-28 | Disposition: A | Discharge status: Home / Self Care | Payer: Self-pay | Attending: Emergency Medicine | Admitting: Emergency Medicine

## 2014-03-28 DIAGNOSIS — L089 Local infection of the skin and subcutaneous tissue, unspecified: Secondary | ICD-10-CM

## 2014-03-28 DIAGNOSIS — F17200 Nicotine dependence, unspecified, uncomplicated: Secondary | ICD-10-CM | POA: Insufficient documentation

## 2014-03-28 DIAGNOSIS — Z8614 Personal history of Methicillin resistant Staphylococcus aureus infection: Secondary | ICD-10-CM | POA: Insufficient documentation

## 2014-03-28 DIAGNOSIS — Z8669 Personal history of other diseases of the nervous system and sense organs: Secondary | ICD-10-CM | POA: Insufficient documentation

## 2014-03-28 DIAGNOSIS — Z792 Long term (current) use of antibiotics: Secondary | ICD-10-CM | POA: Insufficient documentation

## 2014-03-28 DIAGNOSIS — L0889 Other specified local infections of the skin and subcutaneous tissue: Secondary | ICD-10-CM | POA: Insufficient documentation

## 2014-03-28 HISTORY — DX: Restless legs syndrome: G25.81

## 2014-03-28 HISTORY — DX: Carrier or suspected carrier of methicillin resistant Staphylococcus aureus: Z22.322

## 2014-03-28 MED ORDER — DOXYCYCLINE HYCLATE 100 MG PO TABS: 100.0000 mg | ORAL_TABLET | Freq: Two times a day (BID) | ORAL | Status: DC

## 2014-03-28 MED ORDER — HYDROCODONE-ACETAMINOPHEN 5-325 MG PO TABS: 1.0000 | ORAL_TABLET | Freq: Once | ORAL | Status: DC

## 2014-03-28 MED ORDER — SULFAMETHOXAZOLE-TMP DS 800-160 MG PO TABS: 1.0000 | ORAL_TABLET | Freq: Two times a day (BID) | ORAL | Status: AC

## 2014-03-28 MED ORDER — OXYCODONE-ACETAMINOPHEN 5-325 MG PO TABS: 1.0000 | ORAL_TABLET | ORAL | Status: DC | PRN

## 2014-03-28 MED ORDER — SULFAMETHOXAZOLE-TMP DS 800-160 MG PO TABS
1.0000 | ORAL_TABLET | Freq: Once | ORAL | Status: AC
  Administered 2014-03-28: 1 via ORAL
  Filled 2014-03-28: qty 1

## 2014-03-28 MED ORDER — OXYCODONE-ACETAMINOPHEN 5-325 MG PO TABS
2.0000 | ORAL_TABLET | Freq: Once | ORAL | Status: AC
  Administered 2014-03-28: 2 via ORAL
  Filled 2014-03-28: qty 2

## 2014-03-28 NOTE — ED Notes (Signed)
Patient c/o rash to lower back. States rash started yesterday, wife states 2-3 days. Patient states he had MRSA lower on his back/buttocks area @2  weeks ago, spending 9 days admitted to hospital.

## 2014-03-28 NOTE — ED Notes (Signed)
NP at bedside.

## 2014-03-28 NOTE — ED Provider Notes (Signed)
CSN: [id number]     Arrival date & time 03/28/14  2154 History  This chart was scribed for non-physician practitioner, Earley FavorGail Schulz, NP, working with Linwood DibblesJon Knapp, MD, by Bronson CurbJacqueline Melvin, ED Scribe. This patient was seen in room WTR8/WTR8 and the patient's care was started at 10:29 PM.    Chief Complaint  Patient presents with  . Rash    lower back    The history is provided by the patient. No language interpreter was used.    HPI Comments: Joseph Spence is a 43 y.o. male who presents to the Emergency Department complaining of rash to his lower back that has been persistent since the beginning of June. Patient has history of multiple abscesses that have been I&D'd. Patient was perscribed Doxycycline as treatment, and has 2 days left. Patient was informed to follow up at wound care in two weeks. There is associated pain. Patient denies itching, SOB, nausea, emesis, fever, or chills. Patient has no history of significant health conditions. Patient is not established with a PCP.     Past Medical History  Diagnosis Date  . MRSA (methicillin resistant staph aureus) culture positive   . RLS (restless legs syndrome)    Past Surgical History  Procedure Laterality Date  . Appendectomy    . Hernia repair     Family History  Problem Relation Age of Onset  . Lymphoma Father    History  Substance Use Topics  . Smoking status: Current Every Day Smoker -- 1.00 packs/day    Types: Cigarettes  . Smokeless tobacco: Not on file  . Alcohol Use: No    Review of Systems  Constitutional: Negative for fever and chills.  Respiratory: Negative for shortness of breath.   Gastrointestinal: Negative for nausea and vomiting.  Skin: Positive for rash.      Allergies  Vicodin  Home Medications   Prior to Admission medications   Medication Sig Start Date End Date Taking? Authorizing Tod Abrahamsen  doxycycline (VIBRA-TABS) 100 MG tablet Take 1 tablet (100 mg total) by mouth 2 (two) times daily. For  14 days 03/28/14   Arman FilterGail K Schulz, NP  oxyCODONE (OXY IR/ROXICODONE) 5 MG immediate release tablet Take 1 tablet (5 mg total) by mouth every 4 (four) hours as needed for severe pain. 03/20/14   Henderson CloudEstela Y Hernandez Acosta, MD  oxyCODONE-acetaminophen (PERCOCET/ROXICET) 5-325 MG per tablet Take 1 tablet by mouth every 4 (four) hours as needed for severe pain. 03/28/14   Arman FilterGail K Schulz, NP  sulfamethoxazole-trimethoprim (BACTRIM DS) 800-160 MG per tablet Take 1 tablet by mouth 2 (two) times daily. 03/28/14 04/11/14  Arman FilterGail K Schulz, NP   Triage Vitals: BP 150/95  Pulse 87  Temp(Src) 97.9 F (36.6 C) (Oral)  Resp 16  SpO2 99%  Physical Exam  Nursing note and vitals reviewed. Constitutional: He is oriented to person, place, and time. He appears well-developed and well-nourished. No distress.  HENT:  Head: Normocephalic and atraumatic.  Eyes: EOM are normal. Pupils are equal, round, and reactive to light.  Neck: Normal range of motion. Neck supple.  Cardiovascular: Normal rate.   Pulmonary/Chest: Effort normal.  Musculoskeletal: Normal range of motion.  Neurological: He is alert and oriented to person, place, and time.  Skin: Skin is warm and dry. There is erythema.  Area of consolidated pustules along belt ine more on Left than right but on both side of spine inconsistent with herpes   Psychiatric: He has a normal mood and affect. His behavior is normal.  ED Course  Procedures (including critical care time)  DIAGNOSTIC STUDIES: Oxygen Saturation is 99% on room air, normal by my interpretation.    COORDINATION OF CARE: At 2238 Discussed treatment plan with patient which includes Bactrim and Vicodin. Patient agrees.   Labs Review Labs Reviewed  HERPES SIMPLEX VIRUS CULTURE    Imaging Review No results found.   EKG Interpretation None      MDM  Added Septra to Doxy for additional 14 days Als have taken Herpes culture Patientto rerturn in 2 days for recheck  Final diagnoses:   Pustular inflammation of skin       I personally performed the services described in this documentation, which was scribed in my presence. The recorded information has been reviewed and is accurate.   Arman FilterGail K Schulz, NP 03/28/14 2256  Arman FilterGail K Schulz, NP 03/28/14 [phone]

## 2014-03-28 NOTE — Discharge Instructions (Signed)
I have added an additional antibiotic Please take both for the full 14 days  I would also like to see you back in 2 days for a recheck of the area. I have also take a herpes cuture of the area to full out a viral infection The results will be available when you return for recheck

## 2014-03-29 NOTE — ED Provider Notes (Signed)
Medical screening examination/treatment/procedure(s) were performed by non-physician practitioner and as supervising physician I was immediately available for consultation/collaboration.    Linwood DibblesJon Knapp, MD 03/29/14 [phone]

## 2014-03-30 LAB — HERPES SIMPLEX VIRUS CULTURE: Culture: NOT DETECTED

## 2014-04-05 ENCOUNTER — Encounter (HOSPITAL_COMMUNITY): Payer: Self-pay | Admitting: Emergency Medicine

## 2014-04-05 ENCOUNTER — Emergency Department (HOSPITAL_COMMUNITY)
Admission: EM | Admit: 2014-04-05 | Discharge: 2014-04-05 | Disposition: A | Discharge status: Home / Self Care | Payer: Self-pay | Attending: Emergency Medicine | Admitting: Emergency Medicine

## 2014-04-05 ENCOUNTER — Emergency Department (HOSPITAL_COMMUNITY): Payer: Self-pay

## 2014-04-05 DIAGNOSIS — Z79899 Other long term (current) drug therapy: Secondary | ICD-10-CM | POA: Insufficient documentation

## 2014-04-05 DIAGNOSIS — R21 Rash and other nonspecific skin eruption: Secondary | ICD-10-CM | POA: Insufficient documentation

## 2014-04-05 DIAGNOSIS — F17200 Nicotine dependence, unspecified, uncomplicated: Secondary | ICD-10-CM | POA: Insufficient documentation

## 2014-04-05 DIAGNOSIS — Z792 Long term (current) use of antibiotics: Secondary | ICD-10-CM | POA: Insufficient documentation

## 2014-04-05 DIAGNOSIS — Z8614 Personal history of Methicillin resistant Staphylococcus aureus infection: Secondary | ICD-10-CM | POA: Insufficient documentation

## 2014-04-05 DIAGNOSIS — R0602 Shortness of breath: Secondary | ICD-10-CM | POA: Insufficient documentation

## 2014-04-05 DIAGNOSIS — Z8669 Personal history of other diseases of the nervous system and sense organs: Secondary | ICD-10-CM | POA: Insufficient documentation

## 2014-04-05 DIAGNOSIS — R209 Unspecified disturbances of skin sensation: Secondary | ICD-10-CM | POA: Insufficient documentation

## 2014-04-05 DIAGNOSIS — R200 Anesthesia of skin: Secondary | ICD-10-CM

## 2014-04-05 DIAGNOSIS — R0600 Dyspnea, unspecified: Secondary | ICD-10-CM

## 2014-04-05 MED ORDER — ACETAMINOPHEN 325 MG PO TABS
650.0000 mg | ORAL_TABLET | Freq: Once | ORAL | Status: AC
  Administered 2014-04-05: 650 mg via ORAL
  Filled 2014-04-05: qty 2

## 2014-04-05 MED ORDER — ALBUTEROL SULFATE HFA 108 (90 BASE) MCG/ACT IN AERS
2.0000 | INHALATION_SPRAY | Freq: Once | RESPIRATORY_TRACT | Status: AC
  Administered 2014-04-05: 2 via RESPIRATORY_TRACT
  Filled 2014-04-05: qty 6.7

## 2014-04-05 NOTE — ED Notes (Addendum)
Pt c/o recurrent skin infection on back x "several weeks" and SOB and L arm numbness starting last night.  Pain score 8/10.  Pt reports he was recently admitted for cellulitis and [facility].  Hx of MRSA.  NAD noted.  Pt easily speaking in full sentences.  Pt reports taking all antibiotics as prescribed.

## 2014-04-05 NOTE — ED Notes (Signed)
MD at bedside. 

## 2014-04-05 NOTE — ED Notes (Signed)
Patient transported to X-ray 

## 2014-04-05 NOTE — ED Provider Notes (Signed)
CSN: [id number]     Arrival date & time 04/05/14  [id number] History   First MD Initiated Contact with Patient 04/05/14 [phone]     Chief Complaint  Patient presents with  . Cellulitis  . Shortness of Breath  . Numbness   Patient gave verbal permission to utilize photo for medical documentation only The image was not stored on any personal device    Patient is a 43 y.o. male presenting with rash. The history is provided by the patient.  Rash Location: back. Severity:  Moderate Onset quality:  Gradual Duration: "several weeks" Chronicity:  Recurrent Relieved by:  Nothing Worsened by:  Nothing tried Associated symptoms: shortness of breath   Associated symptoms: no fever and not vomiting   pt presents for multiple complaints:  1. Rash for several weeks, now mostly in lower back.  He has been prescribed antibiotics (bactrim/doxycycline) which he is still taking but does not feel improved.  He was supposed to return for recheck on July 2 but was unable to return until today.  No fever/vomiting reported.  He also mentions two other complaints:  2. - SOB since yesterday.  It has been constant.  Nothing worsens his symptoms.  No cough/hemoptysis.  No CP is reported.  No LE edema.  He denies h/o DVT/PE.  He is a smoker  3. Left arm numbness.  This has been present for one day.  No trauma.  No neck pain.  No associated CP.  No associated weakness.  No other weakness/numbness anywhere in his body.      Past Medical History  Diagnosis Date  . MRSA (methicillin resistant staph aureus) culture positive   . RLS (restless legs syndrome)    Past Surgical History  Procedure Laterality Date  . Appendectomy    . Hernia repair     Family History  Problem Relation Age of Onset  . Lymphoma Father    History  Substance Use Topics  . Smoking status: Current Every Day Smoker -- 1.00 packs/day    Types: Cigarettes  . Smokeless tobacco: Not on file  . Alcohol Use: No    Review of Systems    Constitutional: Negative for fever.  Respiratory: Positive for shortness of breath. Negative for cough.   Cardiovascular: Negative for chest pain.  Gastrointestinal: Negative for vomiting.  Skin: Positive for rash.  Neurological: Negative for weakness.  All other systems reviewed and are negative.     Allergies  Vicodin  Home Medications   Prior to Admission medications   Medication Sig Start Date End Date Taking? Authorizing Provider  doxycycline (VIBRA-TABS) 100 MG tablet Take 1 tablet (100 mg total) by mouth 2 (two) times daily. For 14 days 03/28/14   Arman FilterGail K Schulz, NP  oxyCODONE (OXY IR/ROXICODONE) 5 MG immediate release tablet Take 1 tablet (5 mg total) by mouth every 4 (four) hours as needed for severe pain. 03/20/14   Henderson CloudEstela Y Hernandez Acosta, MD  oxyCODONE-acetaminophen (PERCOCET/ROXICET) 5-325 MG per tablet Take 1 tablet by mouth every 4 (four) hours as needed for severe pain. 03/28/14   Arman FilterGail K Schulz, NP  sulfamethoxazole-trimethoprim (BACTRIM DS) 800-160 MG per tablet Take 1 tablet by mouth 2 (two) times daily. 03/28/14 04/11/14  Arman FilterGail K Schulz, NP   BP 128/87  Pulse 92  Temp(Src) 98.2 F (36.8 C) (Oral)  Resp 20  SpO2 97% Physical Exam CONSTITUTIONAL: Well developed/well nourished, no distress noted HEAD: Normocephalic/atraumatic EYES: EOMI/PERRL ENMT: Mucous membranes moist NECK: supple no meningeal signs SPINE:entire spine  nontender CV: S1/S2 noted, no murmurs/rubs/gallops noted LUNGS: coarse BS bilaterally initially that cleared, no distress noted, speaks in full sentences ABDOMEN: soft, nontender, no rebound or guarding GU:no cva tenderness NEURO: Pt is awake/alert, moves all extremitiesx4 Awake/alert, facies symmetric, no arm or leg drift is noted, equal hand grips noted. Full ROM with equal power of bilateral elbow flex/extension and shoulder abduction.  He uses left arm without difficulty Gait normal without ataxia He reports numbness to left arm only.  No  other focal weakness/sensory loss identified EXTREMITIES: pulses normal, full ROM, no LE edema noted or calf tenderness No edema/erythema noted to left UE SKIN: warm, color normal PSYCH: no abnormalities of mood noted     ED Course  Procedures   10:41 AM Pt is well appearing.  He admits his main complaint is rash.  See photo above.  No signs of abscess or need for surgical drainage.  His most recent herpetic culture negative.  Advised need for f/u with dermatology as outpatient and continue his oral antibiotics.  After xray, pt tried to leave prior to discharge.  I discussed xray findings and need for f/u as outpatient with dermatology He ambulated without any difficulty.  He is well appearing.  He wants to leave Low suspicion for ACS/PE at this time.  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  He has no focal weakness to suggest CVA or acute spinal emergency/epidural abscess Imaging Review Dg Chest 2 View  04/05/2014   CLINICAL DATA:  Shortness of breath.  EXAM: CHEST  2 VIEW  COMPARISON:  None.  FINDINGS: Mediastinum and hilar structures normal. Lungs are clear. Heart size normal. No acute bony abnormality.  IMPRESSION: No active cardiopulmonary disease.   Electronically Signed   By: Maisie Fushomas  Register   On: 04/05/2014 10:15     EKG Interpretation   Date/Time:  Wednesday April 05 2014 09:42:03 EDT Ventricular Rate:  87 PR Interval:  145 QRS Duration: 98 QT Interval:  367 QTC Calculation: 441 R Axis:   19 Text Interpretation:  Sinus rhythm ST elev, probable normal early repol  pattern Baseline wander in lead(s) V1 V2 artifact noted Confirmed by  Bebe ShaggyWICKLINE  MD, Richey Doolittle ([id number]) on 04/05/2014 9:48:16 AM      MDM   Final diagnoses:  Rash  Dyspnea  Numbness    Nursing notes including past medical history and social history reviewed and considered in documentation xrays reviewed and considered Previous records  reviewed and considered     Joya Gaskinsonald W Denijah Karrer, MD 04/05/14 1115

## 2014-04-05 NOTE — ED Notes (Signed)
EDP Wickline to see pt just before discharge.

## 2014-04-05 NOTE — Progress Notes (Signed)
[facility] provided pt with a list of primary care resources and a [facility] [employer] Card application to help patient establish primary care.  °

## 2014-04-05 NOTE — ED Notes (Signed)
Found pt coming back from Endo area. Pharm tech SwantonAmanda witnessed. Pt states can hurt at home with tylenol. Pt in NAD.  EDP Wicklind made aware. Pt ready for discharge

## 2014-05-04 ENCOUNTER — Encounter (HOSPITAL_COMMUNITY): Payer: Self-pay | Admitting: Emergency Medicine

## 2014-05-04 ENCOUNTER — Emergency Department (HOSPITAL_COMMUNITY)
Admission: EM | Admit: 2014-05-04 | Discharge: 2014-05-04 | Disposition: A | Discharge status: Home / Self Care | Payer: Self-pay | Attending: Emergency Medicine | Admitting: Emergency Medicine

## 2014-05-04 DIAGNOSIS — L03317 Cellulitis of buttock: Principal | ICD-10-CM

## 2014-05-04 DIAGNOSIS — L089 Local infection of the skin and subcutaneous tissue, unspecified: Secondary | ICD-10-CM | POA: Insufficient documentation

## 2014-05-04 DIAGNOSIS — F17200 Nicotine dependence, unspecified, uncomplicated: Secondary | ICD-10-CM | POA: Insufficient documentation

## 2014-05-04 DIAGNOSIS — Z8614 Personal history of Methicillin resistant Staphylococcus aureus infection: Secondary | ICD-10-CM | POA: Insufficient documentation

## 2014-05-04 DIAGNOSIS — L0231 Cutaneous abscess of buttock: Secondary | ICD-10-CM | POA: Insufficient documentation

## 2014-05-04 DIAGNOSIS — Z8669 Personal history of other diseases of the nervous system and sense organs: Secondary | ICD-10-CM | POA: Insufficient documentation

## 2014-05-04 MED ORDER — LIDOCAINE-EPINEPHRINE (PF) 1 %-1:200000 IJ SOLN
INTRAMUSCULAR | Status: AC
  Administered 2014-05-04: 21:00:00
  Filled 2014-05-04: qty 10

## 2014-05-04 MED ORDER — OXYCODONE-ACETAMINOPHEN 5-325 MG PO TABS: 2.0000 | ORAL_TABLET | Freq: Four times a day (QID) | ORAL | Status: DC | PRN

## 2014-05-04 MED ORDER — OXYCODONE-ACETAMINOPHEN 5-325 MG PO TABS
2.0000 | ORAL_TABLET | Freq: Once | ORAL | Status: AC
  Administered 2014-05-04: 2 via ORAL
  Filled 2014-05-04: qty 2

## 2014-05-04 MED ORDER — CEPHALEXIN 500 MG PO CAPS: 500.0000 mg | ORAL_CAPSULE | Freq: Four times a day (QID) | ORAL | Status: DC

## 2014-05-04 MED ORDER — CEPHALEXIN 250 MG PO CAPS
250.0000 mg | ORAL_CAPSULE | Freq: Once | ORAL | Status: AC
  Administered 2014-05-04: 250 mg via ORAL
  Filled 2014-05-04 (×2): qty 1

## 2014-05-04 MED ORDER — SULFAMETHOXAZOLE-TMP DS 800-160 MG PO TABS
1.0000 | ORAL_TABLET | Freq: Once | ORAL | Status: AC
  Administered 2014-05-04: 1 via ORAL
  Filled 2014-05-04: qty 1

## 2014-05-04 MED ORDER — SULFAMETHOXAZOLE-TRIMETHOPRIM 800-160 MG PO TABS: 1.0000 | ORAL_TABLET | Freq: Two times a day (BID) | ORAL | Status: DC

## 2014-05-04 NOTE — ED Notes (Signed)
Pt aware he is to take abx regimen until completed. Knows not to drink, drive while taking percocet and not to take extra tylenol. No other questions/concerns.

## 2014-05-04 NOTE — ED Notes (Signed)
Pt complains of recurrent skin infections on his back and hip for about 5 days, pt has hx of MRSA

## 2014-05-04 NOTE — ED Provider Notes (Signed)
CSN: [id number]     Arrival date & time 05/04/14  1907 History   First MD Initiated Contact with Patient 05/04/14 1952     Chief Complaint  Patient presents with  . Recurrent Skin Infections     (Consider location/radiation/quality/duration/timing/severity/associated sxs/prior Treatment) HPI Comments: Patient presents to the emergency department with chief complaint of abscess. He states that he has had an abscess on his left buttock for the past 5 days. It has been progressively worsening. He has had a similar complaint in the past. He has been admitted in the past. He has previous history of MRSA. Patient states pain is severe. It does not radiate. He denies any fevers, chills, nausea, or vomiting. He has not tried anything to alleviate his symptoms. Reports that he is homeless.  The history is provided by the patient. No language interpreter was used.    Past Medical History  Diagnosis Date  . MRSA (methicillin resistant staph aureus) culture positive   . RLS (restless legs syndrome)    Past Surgical History  Procedure Laterality Date  . Appendectomy    . Hernia repair     Family History  Problem Relation Age of Onset  . Lymphoma Father    History  Substance Use Topics  . Smoking status: Current Every Day Smoker -- 1.00 packs/day    Types: Cigarettes  . Smokeless tobacco: Not on file  . Alcohol Use: No    Review of Systems  All other systems reviewed and are negative.     Allergies  Vicodin  Home Medications   Prior to Admission medications   Medication Sig Start Date End Date Taking? Authorizing Provider  acetaminophen (TYLENOL) 500 MG tablet Take 1,000 mg by mouth every 6 (six) hours as needed for mild pain.   Yes Historical Provider, MD   BP 128/80  Pulse 83  Temp(Src) 98.4 F (36.9 C) (Oral)  Resp 16  SpO2 98% Physical Exam  Nursing note and vitals reviewed. Constitutional: He is oriented to person, place, and time. He appears well-developed and  well-nourished.  HENT:  Head: Normocephalic and atraumatic.  Eyes: Conjunctivae and EOM are normal. Pupils are equal, round, and reactive to light. Right eye exhibits no discharge. Left eye exhibits no discharge. No scleral icterus.  Neck: Normal range of motion. Neck supple. No JVD present.  Cardiovascular: Normal rate, regular rhythm and normal heart sounds.  Exam reveals no gallop and no friction rub.   No murmur heard. Pulmonary/Chest: Effort normal and breath sounds normal. No respiratory distress. He has no wheezes. He has no rales. He exhibits no tenderness.  Abdominal: Soft. He exhibits no distension and no mass. There is no tenderness. There is no rebound and no guarding.  Musculoskeletal: Normal range of motion. He exhibits no edema and no tenderness.  Neurological: He is alert and oriented to person, place, and time.  Skin: Skin is warm and dry.     5 by 5 cm abscess area of induration to the upper left buttock, no discharge or drainage  Additional 2 x 2 centimeter abscess to the right hip, no induration, no fluctuance, appears to have drained thoroughly  Psychiatric: He has a normal mood and affect. His behavior is normal. Judgment and thought content normal.    ED Course  Procedures (including critical care time) Labs Review Labs Reviewed - No data to display  Imaging Review No results found.   EKG Interpretation None     INCISION AND DRAINAGE Performed by: Roxy Horseman  Consent: Verbal consent obtained. Risks and benefits: risks, benefits and alternatives were discussed Type: abscess  Body area: Left buttock  Anesthesia: local infiltration  Incision was made with a scalpel.  Local anesthetic: lidocaine 1% with epinephrine  Anesthetic total: 10 ml  Complexity: complex Blunt dissection to break up loculations  Drainage: purulent  Drainage amount: moderate  Packing material: 1/4 in iodoform gauze  Patient tolerance: Patient tolerated the  procedure well with no immediate complications.    MDM   Final diagnoses:  Abscess of buttock, left    Homeless patient with abscess and surrounding cellulitis.  No immunocompromised diseases.  Abscess drained in the ED.  Discharge to home with antibiotics for surrounding cellulitis.  Strict return precautions given.  Return in 2 days for packing removal and wound check.    Roxy Horsemanobert Trevion Hoben, PA-C 05/04/14 2219

## 2014-05-04 NOTE — Discharge Instructions (Signed)

## 2014-05-05 NOTE — ED Provider Notes (Signed)
Medical screening examination/treatment/procedure(s) were performed by non-physician practitioner and as supervising physician I was immediately available for consultation/collaboration.   EKG Interpretation None        Megan Docherty, MD 05/05/14 0031 

## 2014-05-07 ENCOUNTER — Emergency Department (HOSPITAL_COMMUNITY): Payer: Self-pay

## 2014-05-07 ENCOUNTER — Encounter (HOSPITAL_COMMUNITY): Payer: Self-pay | Admitting: Emergency Medicine

## 2014-05-07 ENCOUNTER — Emergency Department (HOSPITAL_COMMUNITY)
Admission: EM | Admit: 2014-05-07 | Discharge: 2014-05-08 | Disposition: A | Discharge status: Home / Self Care | Payer: Self-pay | Attending: Emergency Medicine | Admitting: Emergency Medicine

## 2014-05-07 ENCOUNTER — Emergency Department (HOSPITAL_COMMUNITY)
Admission: EM | Admit: 2014-05-07 | Discharge: 2014-05-07 | Disposition: A | Discharge status: Home / Self Care | Payer: Self-pay | Attending: Emergency Medicine | Admitting: Emergency Medicine

## 2014-05-07 DIAGNOSIS — L03317 Cellulitis of buttock: Principal | ICD-10-CM

## 2014-05-07 DIAGNOSIS — Z8669 Personal history of other diseases of the nervous system and sense organs: Secondary | ICD-10-CM | POA: Insufficient documentation

## 2014-05-07 DIAGNOSIS — Z792 Long term (current) use of antibiotics: Secondary | ICD-10-CM | POA: Insufficient documentation

## 2014-05-07 DIAGNOSIS — F1120 Opioid dependence, uncomplicated: Secondary | ICD-10-CM | POA: Insufficient documentation

## 2014-05-07 DIAGNOSIS — Z8619 Personal history of other infectious and parasitic diseases: Secondary | ICD-10-CM | POA: Insufficient documentation

## 2014-05-07 DIAGNOSIS — Z8614 Personal history of Methicillin resistant Staphylococcus aureus infection: Secondary | ICD-10-CM | POA: Insufficient documentation

## 2014-05-07 DIAGNOSIS — F17200 Nicotine dependence, unspecified, uncomplicated: Secondary | ICD-10-CM | POA: Insufficient documentation

## 2014-05-07 DIAGNOSIS — L0231 Cutaneous abscess of buttock: Secondary | ICD-10-CM | POA: Insufficient documentation

## 2014-05-07 DIAGNOSIS — F1010 Alcohol abuse, uncomplicated: Secondary | ICD-10-CM | POA: Insufficient documentation

## 2014-05-07 DIAGNOSIS — F1110 Opioid abuse, uncomplicated: Secondary | ICD-10-CM

## 2014-05-07 DIAGNOSIS — Z79899 Other long term (current) drug therapy: Secondary | ICD-10-CM | POA: Insufficient documentation

## 2014-05-07 HISTORY — DX: Chronic viral hepatitis C: B18.2

## 2014-05-07 HISTORY — DX: Opioid abuse, uncomplicated: F11.10

## 2014-05-07 LAB — RAPID URINE DRUG SCREEN, HOSP PERFORMED
AMPHETAMINES: NOT DETECTED
BARBITURATES: NOT DETECTED
BENZODIAZEPINES: NOT DETECTED
Cocaine: NOT DETECTED
Opiates: POSITIVE — AB
Tetrahydrocannabinol: POSITIVE — AB

## 2014-05-07 LAB — COMPREHENSIVE METABOLIC PANEL
ALK PHOS: 100 U/L (ref 39–117)
ALT: 26 U/L (ref 0–53)
AST: 27 U/L (ref 0–37)
Albumin: 3.5 g/dL (ref 3.5–5.2)
Anion gap: 13 (ref 5–15)
BILIRUBIN TOTAL: 0.3 mg/dL (ref 0.3–1.2)
BUN: 7 mg/dL (ref 6–23)
CALCIUM: 9.1 mg/dL (ref 8.4–10.5)
CHLORIDE: 101 meq/L (ref 96–112)
CO2: 24 meq/L (ref 19–32)
Creatinine, Ser: 0.68 mg/dL (ref 0.50–1.35)
GLUCOSE: 93 mg/dL (ref 70–99)
Potassium: 4.3 mEq/L (ref 3.7–5.3)
Sodium: 138 mEq/L (ref 137–147)
Total Protein: 7.7 g/dL (ref 6.0–8.3)

## 2014-05-07 LAB — ACETAMINOPHEN LEVEL

## 2014-05-07 LAB — CBC
HCT: 42.1 % (ref 39.0–52.0)
HEMOGLOBIN: 14.5 g/dL (ref 13.0–17.0)
MCH: 31.7 pg (ref 26.0–34.0)
MCHC: 34.4 g/dL (ref 30.0–36.0)
MCV: 91.9 fL (ref 78.0–100.0)
Platelets: 321 10*3/uL (ref 150–400)
RBC: 4.58 MIL/uL (ref 4.22–5.81)
RDW: 13.4 % (ref 11.5–15.5)
WBC: 6.3 10*3/uL (ref 4.0–10.5)

## 2014-05-07 LAB — SALICYLATE LEVEL: Salicylate Lvl: <2 mg/dL — ABNORMAL LOW (ref 2.8–20.0)

## 2014-05-07 LAB — ETHANOL: Alcohol, Ethyl (B): <11 mg/dL (ref 0–11)

## 2014-05-07 MED ORDER — MORPHINE SULFATE 4 MG/ML IJ SOLN
4.0000 mg | Freq: Once | INTRAMUSCULAR | Status: AC
  Administered 2014-05-07: 4 mg via INTRAVENOUS
  Filled 2014-05-07 (×2): qty 1

## 2014-05-07 MED ORDER — ONDANSETRON 4 MG PO TBDP
4.0000 mg | ORAL_TABLET | Freq: Once | ORAL | Status: AC
  Administered 2014-05-07: 4 mg via ORAL
  Filled 2014-05-07: qty 1

## 2014-05-07 MED ORDER — OXYCODONE-ACETAMINOPHEN 5-325 MG PO TABS: 1.0000 | ORAL_TABLET | Freq: Four times a day (QID) | ORAL | Status: DC | PRN

## 2014-05-07 MED ORDER — IOHEXOL 300 MG/ML  SOLN
100.0000 mL | Freq: Once | INTRAMUSCULAR | Status: AC | PRN
  Administered 2014-05-07: 100 mL via INTRAVENOUS

## 2014-05-07 MED ORDER — MORPHINE SULFATE 4 MG/ML IJ SOLN: 4.0000 mg | Freq: Once | INTRAMUSCULAR | Status: DC

## 2014-05-07 NOTE — ED Provider Notes (Signed)
CSN: [id number]     Arrival date & time 05/07/14  2250 History  This chart was scribed for non-physician practitioner working with Shon Baton, MD, by Roxy Cedar ED Scribe. This patient was seen in room WTR4/WLPT4 and the patient's care was started at 11:13 PM Chief Complaint  Patient presents with  . Drug Problem   The history is provided by the patient. No language interpreter was used.    HPI Comments: Joseph Spence is a 43 y.o. male with a prior history of heroin overdose who presents to the Emergency Department complaining of a need for detox from heroin.  Patient last used heroin yesterday.  Patient states he does not drink alcohol.  Patient denies S/I, H/I, hallucinations.  Patient states he came in to the ER today requesting a detox because "he is tired of using it and doesn't want to harm his body anymore". Pt also mentioned being seen at [facility] earlier today requesting for an butt abscess recheck and also requesting for heroine detox but his complaint was not addressed.  Past Medical History  Diagnosis Date  . MRSA (methicillin resistant staph aureus) culture positive   . RLS (restless legs syndrome)   . Heroin abuse   . Hep C w/o coma, chronic    Past Surgical History  Procedure Laterality Date  . Appendectomy    . Hernia repair     Family History  Problem Relation Age of Onset  . Lymphoma Father    History  Substance Use Topics  . Smoking status: Current Every Day Smoker -- 1.00 packs/day    Types: Cigarettes  . Smokeless tobacco: Not on file  . Alcohol Use: No    Review of Systems  Constitutional: Negative for fever and chills.  Gastrointestinal: Negative for nausea, vomiting and diarrhea.  Skin: Negative for rash and wound.  Psychiatric/Behavioral: Negative for suicidal ideas, hallucinations and self-injury.    Allergies  Vicodin  Home Medications   Prior to Admission medications   Medication Sig Start Date End Date Taking? Authorizing  Provider  acetaminophen (TYLENOL) 500 MG tablet Take 1,000 mg by mouth every 6 (six) hours as needed for mild pain.    Historical Provider, MD  cephALEXin (KEFLEX) 500 MG capsule Take 1 capsule (500 mg total) by mouth 4 (four) times daily. 05/04/14   Roxy Horseman, PA-C  oxyCODONE-acetaminophen (PERCOCET) 5-325 MG per tablet Take 1 tablet by mouth every 6 (six) hours as needed. 05/07/14   Purvis Sheffield, MD  oxyCODONE-acetaminophen (PERCOCET/ROXICET) 5-325 MG per tablet Take 2 tablets by mouth every 6 (six) hours as needed for severe pain. 05/04/14   Roxy Horseman, PA-C  sulfamethoxazole-trimethoprim (SEPTRA DS) 800-160 MG per tablet Take 1 tablet by mouth every 12 (twelve) hours. 05/04/14   Roxy Horseman, PA-C   Triage Vitals: BP 120/101  Pulse 94  Temp(Src) 98 F (36.7 C) (Oral)  Resp 20  Wt 146 lb (66.225 kg)  SpO2 99% Physical Exam  Nursing note and vitals reviewed. Constitutional: He is oriented to person, place, and time. He appears well-developed and well-nourished. No distress.  HENT:  Head: Normocephalic and atraumatic.  Eyes: Conjunctivae and EOM are normal.  Neck: Neck supple. No tracheal deviation present.  Cardiovascular: Normal rate.   Pulmonary/Chest: Effort normal. He has no wheezes. He has no rales. He exhibits no tenderness.  Decreased lung sounds  Musculoskeletal: Normal range of motion.  Trackmark noted to bilateral antecubital regions with no sign of infection noted.  Neurological: He is  alert and oriented to person, place, and time.  Skin: Skin is warm and dry.  Psychiatric: He has a normal mood and affect. His behavior is normal.    ED Course  Procedures (including critical care time)  DIAGNOSTIC STUDIES: Oxygen Saturation is 99% on RA, normal by my interpretation.    COORDINATION OF CARE: 11:17 PM- Patient with prior history of heroin overdosing is here today requesting for a detox from heroin. He was insisting to be admitted as an in-patient in the  hospital.  When told that he needs to coordinate with a facility on his own for detox, he seemed to be upset and said that he "didn't have anywhere to go". Will consult TTS.  Pt advised of plan for treatment and pt agrees.  11:45 PM I have consulted with TTS, who agrees to see see and evaluate pt and determine rehab placement.    12:09 AM TTS has evaluated pt and felt pt will benefit from rehab placement. Recommend boarding pt until morning for placement.  Pt agrees with plan.  Psych Hold, clonidine order set placed.   Labs Review Labs Reviewed - No data to display  Imaging Review Ct Pelvis W Contrast  05/07/2014   CLINICAL DATA:  Evaluate LEFT soft tissue buttock abscess.  EXAM: CT PELVIS WITH CONTRAST  TECHNIQUE: Multidetector CT imaging of the pelvis was performed using the standard protocol following the bolus administration of intravenous contrast.  CONTRAST:  100mL OMNIPAQUE IOHEXOL 300 MG/ML  SOLN  COMPARISON:  None.  FINDINGS: The visualized bowel, bladder, and reproductive organs are unremarkable. There are no osseous findings.  In the soft tissues over the back to the LEFT of midline, near the inferior margin of the LEFT sacroiliac joint, is an area of subcutaneous inflammation approximately 8 x 37 x 43 mm (R-L x A-P x C-C). I would not characterize this as an abscess, as it does not appear walled off with enhancing capsule. A better characterization would be LEFT buttock cellulitic change. There does not appear involvement of the gluteus musculature, and there is no extension into the pelvic cavity or presacral space. 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  IMPRESSION: Cellulitic change over the LEFT buttock measuring approximately 8 x 37 x 43 mm. The abnormality is not sufficiently well-defined to be characterized as an abscess.   Electronically Signed   By: Davonna BellingJohn  Curnes M.D.   On: 05/07/2014 16:57     EKG Interpretation None      MDM   Final diagnoses:  Heroin  abuse    BP 120/101  Pulse 94  Temp(Src) 98 F (36.7 C) (Oral)  Resp 20  Wt 146 lb (66.225 kg)  SpO2 99%  I have reviewed nursing notes and vital signs. I personally reviewed the imaging tests through PACS system  I reviewed available ER/hospitalization records thought the EMR   I personally performed the services described in this documentation, which was scribed in my presence. The recorded information has been reviewed and is accurate.     Fayrene HelperBowie Tikita Mabee, PA-C 05/08/14 0010

## 2014-05-07 NOTE — ED Notes (Signed)
Pt arrived to the ED with a need for detox from heroin.  Pt seen at [facility] recently for abscess.  Pt states he wants to detox from heroin.  Pt states last usage was yesterday.

## 2014-05-07 NOTE — ED Notes (Signed)
EDP at bedside  

## 2014-05-07 NOTE — Discharge Instructions (Signed)

## 2014-05-07 NOTE — ED Notes (Signed)
Pt back from CT

## 2014-05-07 NOTE — ED Notes (Signed)
Pt to CT

## 2014-05-07 NOTE — BH Assessment (Signed)
Assessment completed. Consulted with Janann Augustori Burkett, NP who recommended inpatient treatment. Fayrene HelperBowie Tran, PA-C has been notified of the recommendations.

## 2014-05-07 NOTE — ED Notes (Signed)
Bed: WLPT4 Expected date:  Expected time:  Means of arrival:  Comments: EMS/43 yo detox

## 2014-05-07 NOTE — ED Notes (Addendum)
Pt is here to be detoxed from heroin.  Pt last used yesterday at 10 am.  Pt normally injects heroin several times per day.  Denies etoh or use of other types of drugs.  Pt also states he is positive for MRSA and is d/t have packing changed to abscess on L lower back.  He has been taking prescribed antibiotics but does not feel they are working.

## 2014-05-07 NOTE — ED Provider Notes (Signed)
4:03 PM [facility]ed [facility]. Awaiting CT.   6:00 PM: CT shows only cellulitic change. Labs noncontributory. The patient continues to appear well. Will provide another prescription for pain. The patient is willing to seek outpatient resources for detox. I have discussed the diagnosis/risks/treatment options with the patient and believe the pt to be eligible for discharge home to follow-up with wellness center as needed, detox, return here for any worsening. We also discussed returning to the ED immediately if new or worsening sx occur. We discussed the sx which are most concerning (e.g., fever, vomiting, spreading redness, worsening pain) that necessitate immediate return. Medications administered to the patient during their visit and any new prescriptions provided to the patient are listed below.  Medications given during this visit Medications  morphine 4 MG/ML injection 4 mg (not administered)  morphine 4 MG/ML injection 4 mg (4 mg Intravenous Given 05/07/14 1600)  ondansetron (ZOFRAN-ODT) disintegrating tablet 4 mg (4 mg Oral Given 05/07/14 1541)  iohexol (OMNIPAQUE) 300 MG/ML solution 100 mL (100 mLs Intravenous Contrast Given 05/07/14 1613)    New Prescriptions   OXYCODONE-ACETAMINOPHEN (PERCOCET) 5-325 MG PER TABLET    Take 1 tablet by mouth every 6 (six) hours as needed.   Clinical Impression 1. Cellulitis and abscess of buttock      Purvis SheffieldForrest Hayleigh Bawa, MD 05/07/14 1818

## 2014-05-07 NOTE — ED Provider Notes (Signed)
CSN: [id number]     Arrival date & time 05/07/14  1158 History   First MD Initiated Contact with Patient 05/07/14 1413     Chief Complaint  Patient presents with  . detox      heroin abuse  . Abscess     (Consider location/radiation/quality/duration/timing/severity/associated sxs/prior Treatment) HPI  Joseph Spence This 43 year old male with past medical history of MRSA infection, heroin dependence and hepatitis C. He presents for detox and wound care. Patient was seen 2 days ago for a buttock abscess which was incised and drained here in the emergency department. He returns today for packing removal. Patient states that the pain is severe. He has used all 15 of his Percocet. He is taking Bactrim and Keflex as prescribed. He went to severe pain in the buttocks that is now radiating into the right hip. He has pain with walking and feels that it is in the joint. He has a past history of previous buttock abscess which required surgical washout. The patient is also requesting detox from heroin. His girlfriend is currently here with him and is seeking the same. He is using a presently 5 g of heroin daily. He complains he has not had any since yesterday and is having some difficulty with diarrhea but denies nausea, myalgias, diaphoresis. Patient denies any suicidal or homicidal ideation. Past Medical History  Diagnosis Date  . MRSA (methicillin resistant staph aureus) culture positive   . RLS (restless legs syndrome)   . Heroin abuse   . Hep C w/o coma, chronic    Past Surgical History  Procedure Laterality Date  . Appendectomy    . Hernia repair     Family History  Problem Relation Age of Onset  . Lymphoma Father    History  Substance Use Topics  . Smoking status: Current Every Day Smoker -- 1.00 packs/day    Types: Cigarettes  . Smokeless tobacco: Not on file  . Alcohol Use: No    Review of Systems  Ten systems reviewed and are negative for acute change, except as noted in the  HPI.    Allergies  Vicodin  Home Medications   Prior to Admission medications   Medication Sig Start Date End Date Taking? Authorizing Provider  acetaminophen (TYLENOL) 500 MG tablet Take 1,000 mg by mouth every 6 (six) hours as needed for mild pain.   Yes Historical Provider, MD  cephALEXin (KEFLEX) 500 MG capsule Take 1 capsule (500 mg total) by mouth 4 (four) times daily. 05/04/14  Yes Roxy Horsemanobert Browning, PA-C  oxyCODONE-acetaminophen (PERCOCET/ROXICET) 5-325 MG per tablet Take 2 tablets by mouth every 6 (six) hours as needed for severe pain. 05/04/14  Yes Roxy Horsemanobert Browning, PA-C  sulfamethoxazole-trimethoprim (SEPTRA DS) 800-160 MG per tablet Take 1 tablet by mouth every 12 (twelve) hours. 05/04/14  Yes Roxy Horsemanobert Browning, PA-C   BP 117/63  Pulse 84  Temp(Src) 98.3 F (36.8 C) (Oral)  Resp 16  Ht 6\' 1"  (1.854 m)  Wt 146 lb (66.225 kg)  BMI 19.27 kg/m2  SpO2 97% Physical Exam  Nursing note and vitals reviewed. Constitutional: He is oriented to person, place, and time. No distress.  HENT:  Head: Normocephalic and atraumatic.  Eyes: Conjunctivae are normal.  Mydriatic pupils.  Neck: Normal range of motion. Neck supple. No JVD present.  Cardiovascular: Normal rate, regular rhythm and normal heart sounds.   No Murmur  Pulmonary/Chest: Effort normal and breath sounds normal. He has no wheezes.  Abdominal: Soft. Bowel sounds are normal.  He exhibits no distension. There is no tenderness.  Musculoskeletal:       Back:  Deep, indurated area approx 6 x 4 cm. Packing is removed. Erythematous, purulence is still express able from the wound.   Neurological: He is alert and oriented to person, place, and time. GCS eye subscore is 4. GCS verbal subscore is 5. GCS motor subscore is 6.  Skin: Skin is warm. He is not diaphoretic.  Flushed skin     ED Course  Procedures (including critical care time) Labs Review Labs Reviewed  SALICYLATE LEVEL - Abnormal; Notable for the following:    Salicylate  Lvl <2.0 (*)    All other components within normal limits  CBC  COMPREHENSIVE METABOLIC PANEL  ETHANOL  ACETAMINOPHEN LEVEL  URINE RAPID DRUG SCREEN (HOSP PERFORMED)    Imaging Review No results found.   EKG Interpretation None      MDM   Final diagnoses:  None    3:22 PM BP 117/63  Pulse 84  Temp(Src) 98.3 F (36.8 C) (Oral)  Resp 16  Ht 6\' 1"  (1.854 m)  Wt 146 lb (66.225 kg)  BMI 19.27 kg/m2  SpO2 97% Patient with large buttock abscess. C/o pain that is severe. I have spoken with Dr. Lindie Spruce form surgery who recommends a CT pelvis for further evaluation. Patient requests pain control, however he still would like detox.  Patient is getting CT pelvis. I have given report to Dr. Romeo Apple who will assume care of the patient.   Arthor Captain, PA-C 05/09/14 1737

## 2014-05-08 ENCOUNTER — Encounter (HOSPITAL_COMMUNITY): Payer: Self-pay | Admitting: Psychiatry

## 2014-05-08 DIAGNOSIS — F1120 Opioid dependence, uncomplicated: Secondary | ICD-10-CM | POA: Diagnosis present

## 2014-05-08 DIAGNOSIS — F1910 Other psychoactive substance abuse, uncomplicated: Secondary | ICD-10-CM

## 2014-05-08 DIAGNOSIS — F1994 Other psychoactive substance use, unspecified with psychoactive substance-induced mood disorder: Secondary | ICD-10-CM

## 2014-05-08 MED ORDER — ZOLPIDEM TARTRATE 5 MG PO TABS
5.0000 mg | ORAL_TABLET | Freq: Every evening | ORAL | Status: DC | PRN
  Administered 2014-05-08: 5 mg via ORAL
  Filled 2014-05-08: qty 1

## 2014-05-08 MED ORDER — SULFAMETHOXAZOLE-TMP DS 800-160 MG PO TABS
1.0000 | ORAL_TABLET | Freq: Two times a day (BID) | ORAL | Status: DC
  Administered 2014-05-08: 1 via ORAL
  Filled 2014-05-08: qty 1

## 2014-05-08 MED ORDER — NAPROXEN 500 MG PO TABS
500.0000 mg | ORAL_TABLET | Freq: Two times a day (BID) | ORAL | Status: DC | PRN
  Administered 2014-05-08: 500 mg via ORAL
  Filled 2014-05-08: qty 1

## 2014-05-08 MED ORDER — ONDANSETRON 4 MG PO TBDP: 4.0000 mg | ORAL_TABLET | Freq: Four times a day (QID) | ORAL | Status: DC | PRN

## 2014-05-08 MED ORDER — LOPERAMIDE HCL 2 MG PO CAPS: 2.0000 mg | ORAL_CAPSULE | ORAL | Status: DC | PRN

## 2014-05-08 MED ORDER — HYDROXYZINE HCL 25 MG PO TABS
25.0000 mg | ORAL_TABLET | Freq: Four times a day (QID) | ORAL | Status: DC | PRN
  Administered 2014-05-08 (×2): 25 mg via ORAL
  Filled 2014-05-08 (×2): qty 1

## 2014-05-08 MED ORDER — NICOTINE 21 MG/24HR TD PT24
21.0000 mg | MEDICATED_PATCH | Freq: Every day | TRANSDERMAL | Status: DC
  Administered 2014-05-08: 21 mg via TRANSDERMAL
  Filled 2014-05-08: qty 1

## 2014-05-08 MED ORDER — CEPHALEXIN 500 MG PO CAPS
500.0000 mg | ORAL_CAPSULE | Freq: Four times a day (QID) | ORAL | Status: DC
  Administered 2014-05-08: 500 mg via ORAL
  Filled 2014-05-08: qty 1

## 2014-05-08 MED ORDER — METHOCARBAMOL 500 MG PO TABS
500.0000 mg | ORAL_TABLET | Freq: Three times a day (TID) | ORAL | Status: DC | PRN
  Administered 2014-05-08: 500 mg via ORAL
  Filled 2014-05-08: qty 1

## 2014-05-08 MED ORDER — ALUM & MAG HYDROXIDE-SIMETH 200-200-20 MG/5ML PO SUSP: 30.0000 mL | ORAL | Status: DC | PRN

## 2014-05-08 MED ORDER — ONDANSETRON HCL 4 MG PO TABS: 4.0000 mg | ORAL_TABLET | Freq: Three times a day (TID) | ORAL | Status: DC | PRN

## 2014-05-08 MED ORDER — DICYCLOMINE HCL 20 MG PO TABS: 20.0000 mg | ORAL_TABLET | Freq: Four times a day (QID) | ORAL | Status: DC | PRN

## 2014-05-08 MED ORDER — IBUPROFEN 200 MG PO TABS
600.0000 mg | ORAL_TABLET | Freq: Three times a day (TID) | ORAL | Status: DC | PRN
Start: 2014-05-08 — End: 2014-05-08
  Administered 2014-05-08: 600 mg via ORAL
  Filled 2014-05-08: qty 3

## 2014-05-08 NOTE — ED Notes (Addendum)
Dry dressing changed on left buttock. Small amount of drainage noted on old dressing.

## 2014-05-08 NOTE — ED Notes (Signed)
Pt. C/o leg discomfort bilaterally asking for meds.

## 2014-05-08 NOTE — ED Notes (Signed)
Pt hitting the emergency button in his room, when staff asks patient to approach desk for requests pt becoming agitated. Pt states he wants "pills to make me sleep". RN informed pt that he cannot have any sleep meds after 0200, pt began cursing stating "That's bullshit!". Pt mumbling as staff walked out of room.

## 2014-05-08 NOTE — ED Notes (Signed)
Patient arrived to unit with request for detox from heroin. Pt denies SI or plans to harm himself. Pt states "I'm tired of living like this; I need to get straightened out". Pt pleasant and cooperative with assessment. No s/s of distress noted.

## 2014-05-08 NOTE — Consult Note (Signed)
[facility] Face-to-Face Psychiatry Consult   Reason for Consult:  Heroin detox Referring Physician:  EDP  Joseph Spence is an 43 y.o. male. Total Time spent with patient: 20 minutes  Assessment: AXIS I:  Substance Abuse and Substance Induced Mood Disorder AXIS II:  Deferred AXIS III:   Past Medical History  Diagnosis Date  . MRSA (methicillin resistant staph aureus) culture positive   . RLS (restless legs syndrome)   . Heroin abuse   . Hep C w/o coma, chronic    AXIS IV:  economic problems, housing problems, other psychosocial or environmental problems, problems related to social environment and problems with primary support group AXIS V:  61-70 mild symptoms  Plan:  No evidence of imminent risk to self or others at present.  Dr. Dwyane Dee assessed the patient and concurs with the plan.  Subjective:   Joseph Spence is a 43 y.o. male patient does not warrant admission.  HPI:  On admission:  43 y.o. male presenting to [facility] ED requesting detox from heroin. Pt stated "I am sick and I don't want to go back out in the streets because I will use again". Pt denies SI, HI and AVH at this time. Pt did not report any previous suicide attempts or hospitalizations. Pt did not report any current mental health services. Pt is endorsing some depressive symptoms and shared that his sleep is not good if he is not using drugs. Pt did not report any problems with his appetite. Pt denied having access to weapons and did not report any pending criminal charges or upcoming court dates. Pt reported that he has been using heroin for the past 2 years and is currently averaging $150 worth daily. Pt did not report any alcohol use at this time. Pt is reported some withdrawal symptoms and reported that he feels restless and has body aches.  Pt is alert and oriented to person, place and situation. Pt is calm and cooperative throughout this assessment. Pt maintained fair eye contact. Pt motor activity is restless. Thought  process is coherent and relevant. Pt mood is pleasant and affect is congruent with his mood. Pt did not report any physical, sexual or emotional abuse. Pt reported that he does not have a support system  Today:  Patient impatient, wants a rehab bed on assessment.  Encouraged the patient to wait because it takes time.  Denies suicidal/homicidal ideations and hallucinations.  The patient left before paperwork and resources given.  HPI Elements:   Generalized, chronic, years, constant, stressors  Past Psychiatric History: Past Medical History  Diagnosis Date  . MRSA (methicillin resistant staph aureus) culture positive   . RLS (restless legs syndrome)   . Heroin abuse   . Hep C w/o coma, chronic     reports that he has been smoking Cigarettes.  He has been smoking about 1.00 pack per day. He does not have any smokeless tobacco history on file. He reports that he uses illicit drugs (Marijuana). He reports that he does not drink alcohol. Family History  Problem Relation Age of Onset  . Lymphoma Father    Family History Substance Abuse: No Family Supports: No ("Nobody" ) Living Arrangements: Alone Can pt return to current living arrangement?: Yes Abuse/Neglect [facility]) Physical Abuse: Denies Verbal Abuse: Denies Sexual Abuse: Denies Allergies:   Allergies  Allergen Reactions  . Vicodin [Hydrocodone-Acetaminophen] Itching    ACT Assessment Complete:  Yes:    Educational Status    Risk to Self: Risk to self  with the past 6 months Suicidal Ideation: No Suicidal Intent: No Is patient at risk for suicide?: No Suicidal Plan?: No Access to Means: No What has been your use of drugs/alcohol within the last 12 months?: Heroin use daily  Previous Attempts/Gestures: No How many times?: 0 Other Self Harm Risks: No other self harm risk identifed at this time Triggers for Past Attempts: None known Intentional Self Injurious Behavior: None Family Suicide History: No Recent stressful life  event(s): Financial Problems Persecutory voices/beliefs?: No Depression: Yes Depression Symptoms: Tearfulness;Fatigue;Loss of interest in usual pleasures;Feeling worthless/self pity;Feeling angry/irritable Substance abuse history and/or treatment for substance abuse?: Yes Suicide prevention information given to non-admitted patients: Not applicable  Risk to Others: Risk to Others within the past 6 months Homicidal Ideation: No Thoughts of Harm to Others: No Current Homicidal Intent: No Current Homicidal Plan: No Access to Homicidal Means: No Identified Victim: NA History of harm to others?: No Assessment of Violence: None Noted Violent Behavior Description: No violent behaviors observed at this time. Pt is calm and cooperative. Does patient have access to weapons?: No Criminal Charges Pending?: No Does patient have a court date: No  Abuse: Abuse/Neglect Assessment (Assessment to be complete while patient is alone) Physical Abuse: Denies Verbal Abuse: Denies Sexual Abuse: Denies Exploitation of patient/patient's resources: Denies Self-Neglect: Denies  Prior Inpatient Therapy: Prior Inpatient Therapy Prior Inpatient Therapy: No Prior Therapy Dates: NA Prior Therapy Facilty/Provider(s): NA Reason for Treatment: NA  Prior Outpatient Therapy: Prior Outpatient Therapy Prior Outpatient Therapy: No Prior Therapy Dates: NA Prior Therapy Facilty/Provider(s): NA Reason for Treatment: NA  Additional Information: Additional Information 1:1 In Past 12 Months?: No CIRT Risk: No Elopement Risk: No                  Objective: Blood pressure 113/68, pulse 55, temperature 98.1 F (36.7 C), temperature source Oral, resp. rate 20, weight 146 lb (66.225 kg), SpO2 100.00%.Body mass index is 19.27 kg/(m^2). Results for orders placed during the hospital encounter of 05/07/14 (from the past 72 hour(s))  CBC     Status: None   Collection Time    05/07/14 12:30 PM      Result Value  Ref Range   WBC 6.3  4.0 - 10.5 K/uL   RBC 4.58  4.22 - 5.81 MIL/uL   Hemoglobin 14.5  13.0 - 17.0 g/dL   HCT 42.1  39.0 - 52.0 %   MCV 91.9  78.0 - 100.0 fL   MCH 31.7  26.0 - 34.0 pg   MCHC 34.4  30.0 - 36.0 g/dL   RDW 13.4  11.5 - 15.5 %   Platelets 321  150 - 400 K/uL  COMPREHENSIVE METABOLIC PANEL     Status: None   Collection Time    05/07/14 12:30 PM      Result Value Ref Range   Sodium 138  137 - 147 mEq/L   Potassium 4.3  3.7 - 5.3 mEq/L   Chloride 101  96 - 112 mEq/L   CO2 24  19 - 32 mEq/L   Glucose, Bld 93  70 - 99 mg/dL   BUN 7  6 - 23 mg/dL   Creatinine, Ser 0.68  0.50 - 1.35 mg/dL   Calcium 9.1  8.4 - 10.5 mg/dL   Total Protein 7.7  6.0 - 8.3 g/dL   Albumin 3.5  3.5 - 5.2 g/dL   AST 27  0 - 37 U/L   ALT 26  0 - 53 U/L  Alkaline Phosphatase 100  39 - 117 U/L   Total Bilirubin 0.3  0.3 - 1.2 mg/dL   GFR calc non Af Amer >90  >90 mL/min   GFR calc Af Amer >90  >90 mL/min   Comment: (NOTE)     The eGFR has been calculated using the CKD EPI equation.     This calculation has not been validated in all clinical situations.     eGFR's persistently <90 mL/min signify possible Chronic Kidney     Disease.   Anion gap 13  5 - 15  ETHANOL     Status: None   Collection Time    05/07/14 12:30 PM      Result Value Ref Range   Alcohol, Ethyl (B) <11  0 - 11 mg/dL   Comment:            LOWEST DETECTABLE LIMIT FOR     SERUM ALCOHOL IS 11 mg/dL     FOR MEDICAL PURPOSES ONLY  ACETAMINOPHEN LEVEL     Status: None   Collection Time    05/07/14 12:30 PM      Result Value Ref Range   Acetaminophen (Tylenol), Serum <15.0  10 - 30 ug/mL   Comment:            THERAPEUTIC CONCENTRATIONS VARY     SIGNIFICANTLY. A RANGE OF 10-30     ug/mL MAY BE AN EFFECTIVE     CONCENTRATION FOR MANY PATIENTS.     HOWEVER, SOME ARE BEST TREATED     AT CONCENTRATIONS OUTSIDE THIS     RANGE.     ACETAMINOPHEN CONCENTRATIONS     >150 ug/mL AT 4 HOURS AFTER     INGESTION AND >50 ug/mL AT 12      HOURS AFTER INGESTION ARE     OFTEN ASSOCIATED WITH TOXIC     REACTIONS.  SALICYLATE LEVEL     Status: Abnormal   Collection Time    05/07/14 12:30 PM      Result Value Ref Range   Salicylate Lvl <1.6 (*) 2.8 - 20.0 mg/dL  URINE RAPID DRUG SCREEN (HOSP PERFORMED)     Status: Abnormal   Collection Time    05/07/14  5:20 PM      Result Value Ref Range   Opiates POSITIVE (*) NONE DETECTED   Cocaine NONE DETECTED  NONE DETECTED   Benzodiazepines NONE DETECTED  NONE DETECTED   Amphetamines NONE DETECTED  NONE DETECTED   Tetrahydrocannabinol POSITIVE (*) NONE DETECTED   Barbiturates NONE DETECTED  NONE DETECTED   Comment:            DRUG SCREEN FOR MEDICAL PURPOSES     ONLY.  IF CONFIRMATION IS NEEDED     FOR ANY PURPOSE, NOTIFY LAB     WITHIN 5 DAYS.                LOWEST DETECTABLE LIMITS     FOR URINE DRUG SCREEN     Drug Class       Cutoff (ng/mL)     Amphetamine      1000     Barbiturate      200     Benzodiazepine   109     Tricyclics       604     Opiates          300     Cocaine          300  THC              50   Labs are reviewed and are pertinent for no medical issues noted.  Current Facility-Administered Medications  Medication Dose Route Frequency Provider Last Rate Last Dose  . alum & mag hydroxide-simeth (MAALOX/MYLANTA) 200-200-20 MG/5ML suspension 30 mL  30 mL Oral PRN Domenic Moras, PA-C      . cephALEXin (KEFLEX) capsule 500 mg  500 mg Oral QID Domenic Moras, PA-C   500 mg at 05/08/14 9476  . dicyclomine (BENTYL) tablet 20 mg  20 mg Oral Q6H PRN Domenic Moras, PA-C      . hydrOXYzine (ATARAX/VISTARIL) tablet 25 mg  25 mg Oral Q6H PRN Domenic Moras, PA-C   25 mg at 05/08/14 0729  . ibuprofen (ADVIL,MOTRIN) tablet 600 mg  600 mg Oral Q8H PRN Domenic Moras, PA-C   600 mg at 05/08/14 0729  . loperamide (IMODIUM) capsule 2-4 mg  2-4 mg Oral PRN Domenic Moras, PA-C      . methocarbamol (ROBAXIN) tablet 500 mg  500 mg Oral Q8H PRN Domenic Moras, PA-C   500 mg at 05/08/14 0039   . naproxen (NAPROSYN) tablet 500 mg  500 mg Oral BID PRN Domenic Moras, PA-C   500 mg at 05/08/14 1022  . nicotine (NICODERM CQ - dosed in mg/24 hours) patch 21 mg  21 mg Transdermal Daily Domenic Moras, PA-C   21 mg at 05/08/14 5465  . ondansetron (ZOFRAN) tablet 4 mg  4 mg Oral Q8H PRN Domenic Moras, PA-C      . ondansetron (ZOFRAN-ODT) disintegrating tablet 4 mg  4 mg Oral Q6H PRN Domenic Moras, PA-C      . sulfamethoxazole-trimethoprim (BACTRIM DS) 800-160 MG per tablet 1 tablet  1 tablet Oral Q12H Domenic Moras, PA-C   1 tablet at 05/08/14 0022  . zolpidem (AMBIEN) tablet 5 mg  5 mg Oral QHS PRN Domenic Moras, PA-C   5 mg at 05/08/14 0354   Current Outpatient Prescriptions  Medication Sig Dispense Refill  . cephALEXin (KEFLEX) 500 MG capsule Take 1 capsule (500 mg total) by mouth 4 (four) times daily.  40 capsule  0  . oxyCODONE-acetaminophen (PERCOCET/ROXICET) 5-325 MG per tablet Take 2 tablets by mouth every 6 (six) hours as needed for severe pain.  15 tablet  0  . sulfamethoxazole-trimethoprim (SEPTRA DS) 800-160 MG per tablet Take 1 tablet by mouth every 12 (twelve) hours.  20 tablet  0  . acetaminophen (TYLENOL) 500 MG tablet Take 1,000 mg by mouth every 6 (six) hours as needed for mild pain.        Psychiatric Specialty Exam:     Blood pressure 113/68, pulse 55, temperature 98.1 F (36.7 C), temperature source Oral, resp. rate 20, weight 146 lb (66.225 kg), SpO2 100.00%.Body mass index is 19.27 kg/(m^2).  General Appearance: Disheveled  Eye Contact::  Good  Speech:  Normal Rate  Volume:  Normal  Mood:  Anxious  Affect:  Congruent  Thought Process:  Coherent  Orientation:  Full (Time, Place, and Person)  Thought Content:  WDL  Suicidal Thoughts:  No  Homicidal Thoughts:  No  Memory:  Immediate;   Good Recent;   Good Remote;   Good  Judgement:  Fair  Insight:  Fair  Psychomotor Activity:  Normal  Concentration:  Good  Recall:  Golden of Knowledge:Fair  Language: Good  Akathisia:   No  Handed:  Right  AIMS (if indicated):     Assets:  Leisure Time Resilience  Sleep:      Musculoskeletal: Strength & Muscle Tone: within normal limits Gait & Station: normal Patient leans: N/A  Treatment Plan Summary: Discharged himself despite encouragement to stay and await rehab bed.  Joseph Spence, [place] 05/08/2014 11:28 AM

## 2014-05-08 NOTE — ED Notes (Signed)
Patient walked out of his room and directly into a male patient's room purposefully; no physical contact was made. Staff intervened and pt was immediately redirected and returned to his room. Pt informed that he is not allowed in any room other than the bathroom. Pt states "I didn't know where my room was". Pt fully aware of his actions, and was reminded that [employer] was on unit for assistance if behavior was repeated. Pt informed that cameras are in each room for monitoring. Pt verbalizes an understanding of instructions.

## 2014-05-08 NOTE — ED Notes (Signed)
[facility]. Pt. Alert and oriented, denies SI, HI, AVH c/o  Pain in left buttoch. Will medicate and continue to monitor for safety. Pt. Instructed to come to me with problems or concerns. Q 15 minute checks continue.

## 2014-05-08 NOTE — BH Assessment (Signed)
Assessment Note  Joseph Spence is an 43 y.o. male [facility] detox from heroin. Pt stated "I am sick and I don't want to go back out in the streets because I will use again".  Pt denies SI, HI and AVH at this time. Pt did not report any previous suicide attempts or hospitalizations. Pt did not report any current mental health services. Pt is endorsing some depressive symptoms and shared that his sleep is not good if he is not using drugs. Pt did not report any problems with his appetite. Pt denied having access to weapons and did not report any pending criminal charges or upcoming court dates. Pt reported that he has been using heroin for the past 2 years and is currently averaging $150 worth daily. Pt did not report any alcohol use at this time. Pt is reported some withdrawal symptoms and reported that he feels restless and has body aches. Pt is alert and oriented to person, place and situation. Pt is calm and cooperative throughout this assessment. Pt maintained fair eye contact. Pt motor activity is restless. Thought process is coherent and relevant. Pt mood is pleasant and affect is congruent with his mood. Pt did not report any physical, sexual or emotional abuse. Pt reported that he does not have a support system .  Axis I: Substance Abuse and Opioid Use Disorder, Moderate  Axis II: No diagnosis Axis III:  Past Medical History  Diagnosis Date  . MRSA (methicillin resistant staph aureus) culture positive   . RLS (restless legs syndrome)   . Heroin abuse   . Hep C w/o coma, chronic    Axis IV: economic problems Axis V: 41-50 serious symptoms  Past Medical History:  Past Medical History  Diagnosis Date  . MRSA (methicillin resistant staph aureus) culture positive   . RLS (restless legs syndrome)   . Heroin abuse   . Hep C w/o coma, chronic     Past Surgical History  Procedure Laterality Date  . Appendectomy    . Hernia repair      Family History:  Family  History  Problem Relation Age of Onset  . Lymphoma Father     Social History:  reports that he has been smoking Cigarettes.  He has been smoking about 1.00 pack per day. He does not have any smokeless tobacco history on file. He reports that he uses illicit drugs (Marijuana). He reports that he does not drink alcohol.  Additional Social History:  Alcohol / Drug Use History of alcohol / drug use?: Yes Negative Consequences of Use: Financial Substance #1 Name of Substance 1: Heroin  1 - Age of First Use: 40 1 - Amount (size/oz): "$150"  1 - Frequency: daily  1 - Duration: 2 years  1 - Last Use / Amount: 05-06-14 $100 worth   CIWA: CIWA-Ar BP: 120/101 mmHg Pulse Rate: 94 COWS:    Allergies:  Allergies  Allergen Reactions  . Vicodin [Hydrocodone-Acetaminophen] Itching    Home Medications:  (Not in a hospital admission)  OB/GYN Status:  No LMP for male patient.  General Assessment Data Location of Assessment: [facility] ED Is this a Tele or Face-to-Face Assessment?: Face-to-Face Is this an Initial Assessment or a Re-assessment for this encounter?: Initial Assessment Living Arrangements: Alone Can pt return to current living arrangement?: Yes Admission Status: Voluntary Is patient capable of signing voluntary admission?: Yes Transfer from: Home Referral Source: Self/Family/Friend     [facility] Living Arrangements: Alone  Name of Psychiatrist: None reported Name of Therapist: None reported  Education Status Is patient currently in school?: No Current Grade: NA Highest grade of school patient has completed: 8th  Name of school: NA Contact person: NA  Risk to self with the past 6 months Suicidal Ideation: No Suicidal Intent: No Is patient at risk for suicide?: No Suicidal Plan?: No Access to Means: No What has been your use of drugs/alcohol within the last 12 months?: Heroin use daily  Previous Attempts/Gestures: No How many times?: 0 Other Self Harm Risks:  No other self harm risk identifed at this time Triggers for Past Attempts: None known Intentional Self Injurious Behavior: None Family Suicide History: No Recent stressful life event(s): Financial Problems Persecutory voices/beliefs?: No Depression: Yes Depression Symptoms: Tearfulness;Fatigue;Loss of interest in usual pleasures;Feeling worthless/self pity;Feeling angry/irritable Substance abuse history and/or treatment for substance abuse?: Yes Suicide prevention information given to non-admitted patients: Not applicable  Risk to Others within the past 6 months Homicidal Ideation: No Thoughts of Harm to Others: No Current Homicidal Intent: No Current Homicidal Plan: No Access to Homicidal Means: No Identified Victim: NA History of harm to others?: No Assessment of Violence: None Noted Violent Behavior Description: No violent behaviors observed at this time. Pt is calm and cooperative. Does patient have access to weapons?: No Criminal Charges Pending?: No Does patient have a court date: No  Psychosis Hallucinations: None noted Delusions: None noted  Mental Status Report Appear/Hygiene:  (Dressed appropriate) Eye Contact: Fair Motor Activity: Restlessness Speech: Logical/coherent Level of Consciousness: Restless Mood: Pleasant Affect: Appropriate to circumstance Anxiety Level: Minimal Thought Processes: Coherent;Relevant Judgement: Unimpaired Orientation: Person;Place;Situation Obsessive Compulsive Thoughts/Behaviors: None  Cognitive Functioning Concentration: Normal Memory: Recent Intact;Remote Intact IQ: Average Insight: Good Impulse Control: Fair Appetite: Poor Weight Loss: 0 Weight Gain: 0 Sleep: No Change Total Hours of Sleep: 8 Vegetative Symptoms: None  ADLScreening [facility] Assessment Services) Patient's cognitive ability adequate to safely complete daily activities?: Yes Patient able to express need for assistance with ADLs?: Yes Independently performs  ADLs?: Yes (appropriate for developmental age)  Prior Inpatient Therapy Prior Inpatient Therapy: No Prior Therapy Dates: NA Prior Therapy Facilty/Provider(s): NA Reason for Treatment: NA  Prior Outpatient Therapy Prior Outpatient Therapy: No Prior Therapy Dates: NA Prior Therapy Facilty/Provider(s): NA Reason for Treatment: NA  ADL Screening (condition at time of admission) Patient's cognitive ability adequate to safely complete daily activities?: Yes Is the patient deaf or have difficulty hearing?: No Does the patient have difficulty seeing, even when wearing glasses/contacts?: No Does the patient have difficulty concentrating, remembering, or making decisions?: No Patient able to express need for assistance with ADLs?: Yes Does the patient have difficulty dressing or bathing?: No Independently performs ADLs?: Yes (appropriate for developmental age) Does the patient have difficulty walking or climbing stairs?: No       Abuse/Neglect Assessment (Assessment to be complete while patient is alone) Physical Abuse: Denies Verbal Abuse: Denies Sexual Abuse: Denies Exploitation of patient/patient's resources: Denies Self-Neglect: Denies Values / Beliefs Cultural Requests During Hospitalization: None Spiritual Requests During Hospitalization: None        Additional Information 1:1 In Past 12 Months?: No CIRT Risk: No Elopement Risk: No     Disposition:  Disposition Initial Assessment Completed for this Encounter: Yes Disposition of Patient: Inpatient treatment program Type of inpatient treatment program: Adult  On Site Evaluation by:   Reviewed with Physician:    Emon Miggins S 05/08/2014 12:16 AM

## 2014-05-08 NOTE — ED Notes (Signed)
Pt. Complaining multiple times about wait for placement. Pt. Requesting to leave AMA. Tawni LevyJameson Lord NP notified of same. Permission to discharge AMA received.

## 2014-05-08 NOTE — [facility] Suicide Risk Assessment (Signed)
Suicide Risk Assessment  Discharge Assessment     Demographic Factors:  Male and Low socioeconomic status  Total Time spent with patient: 20 minutes  Psychiatric Specialty Exam:     Blood pressure 113/68, pulse 55, temperature 98.1 F (36.7 C), temperature source Oral, resp. rate 20, weight 146 lb (66.225 kg), SpO2 100.00%.Body mass index is 19.27 kg/(m^2).  General Appearance: Disheveled  Eye Contact::  Good  Speech:  Normal Rate  Volume:  Normal  Mood:  Anxious  Affect:  Congruent  Thought Process:  Coherent  Orientation:  Full (Time, Place, and Person)  Thought Content:  WDL  Suicidal Thoughts:  No  Homicidal Thoughts:  No  Memory:  Immediate;   Good Recent;   Good Remote;   Good  Judgement:  Fair  Insight:  Fair  Psychomotor Activity:  Normal  Concentration:  Good  Recall:  Fair  Fund of Knowledge:Fair  Language: Good  Akathisia:  No  Handed:  Right  AIMS (if indicated):     Assets:  Leisure Time Resilience  Sleep:      Musculoskeletal: Strength & Muscle Tone: within normal limits Gait & Station: normal Patient leans: N/A  Mental Status Per Nursing Assessment::   On Admission:   Heroin abuse  Current Mental Status by Physician: NA  Loss Factors: NA  Historical Factors: NA  Risk Reduction Factors:   Sense of responsibility to family  Continued Clinical Symptoms:  Anxiety, substance abuse  Cognitive Features That Contribute To Risk:  None  Suicide Risk:  Minimal: No identifiable suicidal ideation.  Patients presenting with no risk factors but with morbid ruminations; may be classified as minimal risk based on the severity of the depressive symptoms  Discharge Diagnoses:   AXIS I:  Substance Abuse and Substance Induced Mood Disorder AXIS II:  Deferred AXIS III:   Past Medical History  Diagnosis Date  . MRSA (methicillin resistant staph aureus) culture positive   . RLS (restless legs syndrome)   . Heroin abuse   . Hep C w/o coma,  chronic    AXIS IV:  economic problems, housing problems, other psychosocial or environmental problems, problems related to social environment and problems with primary support group AXIS V:  61-70 mild symptoms  Plan Of Care/Follow-up recommendations:  Activity:  as tolerated Diet:  low-sodium heart healthy diet  Is patient on multiple antipsychotic therapies at discharge:  No   Has Patient had three or more failed trials of antipsychotic monotherapy by history:  No  Recommended Plan for Multiple Antipsychotic Therapies: NA    Margarito Dehaas, PMH-NP 05/08/2014, 11:33 AM

## 2014-05-08 NOTE — ED Notes (Signed)
Physician here to see pt. 

## 2014-05-08 NOTE — ED Provider Notes (Signed)
Medical screening examination/treatment/procedure(s) were performed by non-physician practitioner and as supervising physician I was immediately available for consultation/collaboration.   EKG Interpretation None        Shon Batonourtney F Horton, MD 05/08/14 [phone]

## 2014-05-10 NOTE — Consult Note (Signed)
Patient seen, evaluated by me, treatment plan formulated by me. Patient reports he started using heroin, deny suicidal or homicidal ideation. Patient states that he wants long-term rehabilitation.

## 2014-05-11 NOTE — ED Provider Notes (Signed)
Medical screening examination/treatment/procedure(s) were performed by non-physician practitioner and as supervising physician I was immediately available for consultation/collaboration.   EKG Interpretation None        Wagner Tanzi J. Anabela Crayton, MD 05/11/14 1741 

## 2014-10-12 ENCOUNTER — Encounter (HOSPITAL_COMMUNITY): Payer: Self-pay | Admitting: Orthopedic Surgery

## 2015-05-14 ENCOUNTER — Emergency Department (HOSPITAL_COMMUNITY): Payer: Self-pay

## 2015-05-14 ENCOUNTER — Emergency Department (HOSPITAL_COMMUNITY)
Admission: EM | Admit: 2015-05-14 | Discharge: 2015-05-14 | Disposition: A | Discharge status: Home / Self Care | Payer: Self-pay | Attending: Emergency Medicine | Admitting: Emergency Medicine

## 2015-05-14 ENCOUNTER — Encounter (HOSPITAL_COMMUNITY): Payer: Self-pay

## 2015-05-14 DIAGNOSIS — Z8614 Personal history of Methicillin resistant Staphylococcus aureus infection: Secondary | ICD-10-CM | POA: Insufficient documentation

## 2015-05-14 DIAGNOSIS — R110 Nausea: Secondary | ICD-10-CM | POA: Insufficient documentation

## 2015-05-14 DIAGNOSIS — R1011 Right upper quadrant pain: Secondary | ICD-10-CM

## 2015-05-14 DIAGNOSIS — Z8619 Personal history of other infectious and parasitic diseases: Secondary | ICD-10-CM | POA: Insufficient documentation

## 2015-05-14 DIAGNOSIS — Z792 Long term (current) use of antibiotics: Secondary | ICD-10-CM | POA: Insufficient documentation

## 2015-05-14 DIAGNOSIS — Z720 Tobacco use: Secondary | ICD-10-CM | POA: Insufficient documentation

## 2015-05-14 LAB — COMPREHENSIVE METABOLIC PANEL
ALT: 111 U/L — AB (ref 17–63)
AST: 71 U/L — AB (ref 15–41)
Albumin: 3.5 g/dL (ref 3.5–5.0)
Alkaline Phosphatase: 116 U/L (ref 38–126)
Anion gap: 10 (ref 5–15)
BUN: 6 mg/dL (ref 6–20)
CHLORIDE: 105 mmol/L (ref 101–111)
CO2: 22 mmol/L (ref 22–32)
CREATININE: 0.71 mg/dL (ref 0.61–1.24)
Calcium: 8.9 mg/dL (ref 8.9–10.3)
GFR calc Af Amer: >60 mL/min (ref >60)
GFR calc non Af Amer: >60 mL/min (ref >60)
Glucose, Bld: 108 mg/dL — ABNORMAL HIGH (ref 65–99)
Potassium: 3.4 mmol/L — ABNORMAL LOW (ref 3.5–5.1)
SODIUM: 137 mmol/L (ref 135–145)
Total Bilirubin: 1 mg/dL (ref 0.3–1.2)
Total Protein: 7.6 g/dL (ref 6.5–8.1)

## 2015-05-14 LAB — CBC
HCT: 40.6 % (ref 39.0–52.0)
Hemoglobin: 14 g/dL (ref 13.0–17.0)
MCH: 30.9 pg (ref 26.0–34.0)
MCHC: 34.5 g/dL (ref 30.0–36.0)
MCV: 89.6 fL (ref 78.0–100.0)
PLATELETS: 316 10*3/uL (ref 150–400)
RBC: 4.53 MIL/uL (ref 4.22–5.81)
RDW: 14 % (ref 11.5–15.5)
WBC: 16.8 10*3/uL — ABNORMAL HIGH (ref 4.0–10.5)

## 2015-05-14 LAB — LIPASE, BLOOD: LIPASE: 30 U/L (ref 22–51)

## 2015-05-14 MED ORDER — ONDANSETRON HCL 4 MG/2ML IJ SOLN
4.0000 mg | Freq: Once | INTRAMUSCULAR | Status: DC
  Filled 2015-05-14: qty 2

## 2015-05-14 MED ORDER — ONDANSETRON 4 MG PO TBDP: 4.0000 mg | ORAL_TABLET | Freq: Three times a day (TID) | ORAL | Status: DC | PRN

## 2015-05-14 MED ORDER — MORPHINE SULFATE 4 MG/ML IJ SOLN
4.0000 mg | Freq: Once | INTRAMUSCULAR | Status: DC
  Filled 2015-05-14: qty 1

## 2015-05-14 MED ORDER — MELOXICAM 15 MG PO TABS: 15.0000 mg | ORAL_TABLET | Freq: Every day | ORAL | Status: DC

## 2015-05-14 MED ORDER — KETOROLAC TROMETHAMINE 30 MG/ML IJ SOLN
30.0000 mg | Freq: Once | INTRAMUSCULAR | Status: AC
  Administered 2015-05-14: 30 mg via INTRAVENOUS
  Filled 2015-05-14: qty 1

## 2015-05-14 MED ORDER — HALOPERIDOL LACTATE 5 MG/ML IJ SOLN
2.0000 mg | Freq: Once | INTRAMUSCULAR | Status: AC
  Administered 2015-05-14: 2 mg via INTRAVENOUS
  Filled 2015-05-14: qty 1

## 2015-05-14 NOTE — ED Provider Notes (Signed)
CSN: [id number]     Arrival date & time 05/14/15  0041 History   First MD Initiated Contact with Patient 05/14/15 0335     Chief Complaint  Patient presents with  . Abdominal Pain     (Consider location/radiation/quality/duration/timing/severity/associated sxs/prior Treatment) HPI Comments: Patient is a 44 year old male with a past medical history of heroin abuse and hepatitis C who presents with abdominal pain that started 12 ours ago. The pain is located in the RUQ and does not radiate. The pain is described as aching and severe. The pain started gradually and progressively worsened since the onset. No alleviating/aggravating factors. The patient has tried nothing for symptoms without relief. Associated symptoms include nausea. Patient denies fever, headache, vomiting, diarrhea, chest pain, SOB, dysuria, constipation.   Patient is a 44 y.o. male presenting with abdominal pain.  Abdominal Pain Associated symptoms: nausea     Past Medical History  Diagnosis Date  . MRSA (methicillin resistant staph aureus) culture positive   . RLS (restless legs syndrome)   . Heroin abuse   . Hep C w/o coma, chronic    Past Surgical History  Procedure Laterality Date  . Appendectomy    . Hernia repair     Family History  Problem Relation Age of Onset  . Lymphoma Father    Social History  Substance Use Topics  . Smoking status: Current Every Day Smoker -- 1.00 packs/day    Types: Cigarettes  . Smokeless tobacco: None  . Alcohol Use: No    Review of Systems  Gastrointestinal: Positive for nausea and abdominal pain.  All other systems reviewed and are negative.     Allergies  Vicodin  Home Medications   Prior to Admission medications   Medication Sig Start Date End Date Taking? Authorizing Provider  acetaminophen (TYLENOL) 500 MG tablet Take 1,000 mg by mouth every 6 (six) hours as needed for mild pain.    Historical Provider, MD  cephALEXin (KEFLEX) 500 MG capsule Take 1 capsule  (500 mg total) by mouth 4 (four) times daily. 05/04/14   Roxy Horseman, PA-C  oxyCODONE-acetaminophen (PERCOCET/ROXICET) 5-325 MG per tablet Take 2 tablets by mouth every 6 (six) hours as needed for severe pain. 05/04/14   Roxy Horseman, PA-C  sulfamethoxazole-trimethoprim (SEPTRA DS) 800-160 MG per tablet Take 1 tablet by mouth every 12 (twelve) hours. 05/04/14   Roxy Horseman, PA-C   BP 109/90 mmHg  Pulse 79  Temp(Src) 98.3 F (36.8 C) (Oral)  Resp 16  SpO2 90% Physical Exam  Constitutional: He is oriented to person, place, and time. He appears well-developed and well-nourished. No distress.  HENT:  Head: Normocephalic and atraumatic.  Eyes: Conjunctivae and EOM are normal.  Neck: Normal range of motion.  Cardiovascular: Normal rate and regular rhythm.  Exam reveals no gallop and no friction rub.   No murmur heard. Pulmonary/Chest: Effort normal and breath sounds normal. He has no wheezes. He has no rales. He exhibits no tenderness.  Abdominal: Soft. He exhibits no distension. There is tenderness. There is no rebound.  RUQ tenderness to palpation. No other focal tenderness.  Musculoskeletal: Normal range of motion.  Neurological: He is alert and oriented to person, place, and time. Coordination normal.  Speech is goal-oriented. Moves limbs without ataxia.   Skin: Skin is warm and dry.  Psychiatric: He has a normal mood and affect. His behavior is normal.  Nursing note and vitals reviewed.   ED Course  Procedures (including critical care time) Labs Review Labs  Reviewed  COMPREHENSIVE METABOLIC PANEL - Abnormal; Notable for the following:    Potassium 3.4 (*)    Glucose, Bld 108 (*)    AST 71 (*)    ALT 111 (*)    All other components within normal limits  CBC - Abnormal; Notable for the following:    WBC 16.8 (*)    All other components within normal limits  LIPASE, BLOOD  URINALYSIS, ROUTINE W REFLEX MICROSCOPIC (NOT AT [facility])    Imaging Review US Abdomen Limited  Ruq  05/14/2015   CLINICAL DATA:  Right upper quadrant pain.  EXAM: US ABDOMEN LIMITED - RIGHT UPPER QUADRANT  COMPARISON:  None.  FINDINGS: Gallbladder:  No gallstones or wall thickening visualized. No sonographic Murphy sign noted.  Common bile duct:  Diameter: 3 mm  Liver:  No focal lesion identified. Within normal limits in parenchymal echogenicity. Antegrade flow in the imaged portal venous system.  IMPRESSION: Normal right upper quadrant ultrasound.   Electronically Signed   By: Marnee Spring M.D.   On: 05/14/2015 05:28   I, Emilia Beck, personally reviewed and evaluated these images and lab results as part of my medical decision-making.   EKG Interpretation None      MDM   Final diagnoses:  Right upper quadrant pain    5:44 AM Labs show elevated WBC and elevated LFT likely due to alcohol consumption and/or hep c. Vitals stable and patient afebrile. Patient will be discharged with mobic for pain. Vitals stable and patient afebrile.    Emilia Beck, PA-C 05/14/15 1610  April Palumbo, MD 05/14/15 [phone]

## 2015-05-14 NOTE — ED Notes (Signed)
PA at bedside.

## 2015-05-14 NOTE — ED Notes (Signed)
Pt thrashing around in bed and screaming in room alone upon RN entering. Pt laying face down on foot of bed.

## 2015-05-14 NOTE — ED Notes (Signed)
Per [facility], pt from home for bilateral upper abd pain that suddenly started 12 hours ago. Denies any N/V/D. Denies any ETOH use. Given 324 mg ASA because the call came out as a chest pain call. Took some tylenol at home.

## 2015-05-14 NOTE — Discharge Instructions (Signed)
Take mobic for pain and zofran for nausea. Refer to attached documents for more information.

## 2015-05-14 NOTE — ED Notes (Signed)
Patient states that: he can not produce a urine sample as of yet.

## 2016-02-14 ENCOUNTER — Inpatient Hospital Stay (HOSPITAL_COMMUNITY)
Admission: EM | Admit: 2016-02-14 | Discharge: 2016-02-17 | DRG: 918 | Disposition: A | Discharge status: Home / Self Care | Payer: Self-pay | Attending: Internal Medicine | Admitting: Internal Medicine

## 2016-02-14 ENCOUNTER — Encounter (HOSPITAL_COMMUNITY): Payer: Self-pay

## 2016-02-14 ENCOUNTER — Emergency Department (HOSPITAL_COMMUNITY): Payer: Self-pay

## 2016-02-14 DIAGNOSIS — R400 Somnolence: Secondary | ICD-10-CM | POA: Diagnosis present

## 2016-02-14 DIAGNOSIS — F329 Major depressive disorder, single episode, unspecified: Secondary | ICD-10-CM | POA: Diagnosis present

## 2016-02-14 DIAGNOSIS — Z79899 Other long term (current) drug therapy: Secondary | ICD-10-CM

## 2016-02-14 DIAGNOSIS — F1210 Cannabis abuse, uncomplicated: Secondary | ICD-10-CM | POA: Diagnosis present

## 2016-02-14 DIAGNOSIS — F32A Depression, unspecified: Secondary | ICD-10-CM | POA: Diagnosis present

## 2016-02-14 DIAGNOSIS — T401X1A Poisoning by heroin, accidental (unintentional), initial encounter: Principal | ICD-10-CM | POA: Diagnosis present

## 2016-02-14 DIAGNOSIS — D72829 Elevated white blood cell count, unspecified: Secondary | ICD-10-CM | POA: Diagnosis present

## 2016-02-14 DIAGNOSIS — F17210 Nicotine dependence, cigarettes, uncomplicated: Secondary | ICD-10-CM | POA: Diagnosis present

## 2016-02-14 DIAGNOSIS — F1110 Opioid abuse, uncomplicated: Secondary | ICD-10-CM | POA: Diagnosis present

## 2016-02-14 DIAGNOSIS — E860 Dehydration: Secondary | ICD-10-CM | POA: Diagnosis present

## 2016-02-14 DIAGNOSIS — E876 Hypokalemia: Secondary | ICD-10-CM | POA: Diagnosis present

## 2016-02-14 DIAGNOSIS — R0681 Apnea, not elsewhere classified: Secondary | ICD-10-CM | POA: Diagnosis present

## 2016-02-14 DIAGNOSIS — R05 Cough: Secondary | ICD-10-CM | POA: Diagnosis present

## 2016-02-14 DIAGNOSIS — G2581 Restless legs syndrome: Secondary | ICD-10-CM | POA: Diagnosis present

## 2016-02-14 DIAGNOSIS — R0902 Hypoxemia: Secondary | ICD-10-CM | POA: Diagnosis present

## 2016-02-14 DIAGNOSIS — Z885 Allergy status to narcotic agent status: Secondary | ICD-10-CM

## 2016-02-14 DIAGNOSIS — Z8614 Personal history of Methicillin resistant Staphylococcus aureus infection: Secondary | ICD-10-CM

## 2016-02-14 DIAGNOSIS — T407X1A Poisoning by cannabis (derivatives), accidental (unintentional), initial encounter: Secondary | ICD-10-CM | POA: Diagnosis present

## 2016-02-14 DIAGNOSIS — T50901A Poisoning by unspecified drugs, medicaments and biological substances, accidental (unintentional), initial encounter: Secondary | ICD-10-CM | POA: Diagnosis present

## 2016-02-14 DIAGNOSIS — B182 Chronic viral hepatitis C: Secondary | ICD-10-CM | POA: Diagnosis present

## 2016-02-14 LAB — COMPREHENSIVE METABOLIC PANEL
ALT: 37 U/L (ref 17–63)
AST: 42 U/L — AB (ref 15–41)
Albumin: 3.8 g/dL (ref 3.5–5.0)
Alkaline Phosphatase: 74 U/L (ref 38–126)
Anion gap: 6 (ref 5–15)
BILIRUBIN TOTAL: 0.6 mg/dL (ref 0.3–1.2)
BUN: 9 mg/dL (ref 6–20)
CO2: 26 mmol/L (ref 22–32)
CREATININE: 0.86 mg/dL (ref 0.61–1.24)
Calcium: 8 mg/dL — ABNORMAL LOW (ref 8.9–10.3)
Chloride: 108 mmol/L (ref 101–111)
Glucose, Bld: 129 mg/dL — ABNORMAL HIGH (ref 65–99)
POTASSIUM: 3.1 mmol/L — AB (ref 3.5–5.1)
Sodium: 140 mmol/L (ref 135–145)
TOTAL PROTEIN: 6.8 g/dL (ref 6.5–8.1)

## 2016-02-14 LAB — CBC WITH DIFFERENTIAL/PLATELET
BASOS ABS: 0 10*3/uL (ref 0.0–0.1)
Basophils Relative: 0 %
EOS PCT: 0 %
Eosinophils Absolute: 0 10*3/uL (ref 0.0–0.7)
HEMATOCRIT: 38.8 % — AB (ref 39.0–52.0)
Hemoglobin: 13.1 g/dL (ref 13.0–17.0)
LYMPHS ABS: 1.1 10*3/uL (ref 0.7–4.0)
LYMPHS PCT: 6 %
MCH: 31.3 pg (ref 26.0–34.0)
MCHC: 33.8 g/dL (ref 30.0–36.0)
MCV: 92.8 fL (ref 78.0–100.0)
MONO ABS: 1.6 10*3/uL — AB (ref 0.1–1.0)
Monocytes Relative: 9 %
NEUTROS ABS: 14.9 10*3/uL — AB (ref 1.7–7.7)
Neutrophils Relative %: 85 %
PLATELETS: 255 10*3/uL (ref 150–400)
RBC: 4.18 MIL/uL — ABNORMAL LOW (ref 4.22–5.81)
RDW: 13.8 % (ref 11.5–15.5)
WBC: 17.6 10*3/uL — ABNORMAL HIGH (ref 4.0–10.5)

## 2016-02-14 LAB — MAGNESIUM: Magnesium: 1.8 mg/dL (ref 1.7–2.4)

## 2016-02-14 LAB — TSH: TSH: 0.454 u[IU]/mL (ref 0.350–4.500)

## 2016-02-14 LAB — PHOSPHORUS: Phosphorus: 2.5 mg/dL (ref 2.5–4.6)

## 2016-02-14 LAB — MRSA PCR SCREENING: MRSA BY PCR: NEGATIVE

## 2016-02-14 LAB — ETHANOL

## 2016-02-14 MED ORDER — SERTRALINE HCL 50 MG PO TABS
50.0000 mg | ORAL_TABLET | Freq: Every day | ORAL | Status: DC
  Administered 2016-02-14 – 2016-02-17 (×4): 50 mg via ORAL
  Filled 2016-02-14 (×4): qty 1

## 2016-02-14 MED ORDER — HEPARIN SODIUM (PORCINE) 5000 UNIT/ML IJ SOLN
5000.0000 [IU] | Freq: Three times a day (TID) | INTRAMUSCULAR | Status: DC
  Administered 2016-02-15: 5000 [IU] via SUBCUTANEOUS
  Filled 2016-02-14: qty 1

## 2016-02-14 MED ORDER — NALOXONE HCL 0.4 MG/ML IJ SOLN
0.4000 mg | Freq: Once | INTRAMUSCULAR | Status: AC
  Administered 2016-02-14: 0.4 mg via INTRAVENOUS
  Filled 2016-02-14: qty 1

## 2016-02-14 MED ORDER — ONDANSETRON HCL 4 MG PO TABS: 4.0000 mg | ORAL_TABLET | Freq: Four times a day (QID) | ORAL | Status: DC | PRN

## 2016-02-14 MED ORDER — SODIUM CHLORIDE 0.9 % IV SOLN
INTRAVENOUS | Status: DC
  Administered 2016-02-14: 16:00:00 via INTRAVENOUS
  Administered 2016-02-15: 100 mL via INTRAVENOUS

## 2016-02-14 MED ORDER — NALOXONE HCL 2 MG/2ML IJ SOSY
0.2500 mg/h | PREFILLED_SYRINGE | INTRAVENOUS | Status: DC
  Administered 2016-02-14 – 2016-02-15 (×3): 0.25 mg/h via INTRAVENOUS
  Filled 2016-02-14 (×3): qty 4

## 2016-02-14 MED ORDER — NICOTINE 14 MG/24HR TD PT24
14.0000 mg | MEDICATED_PATCH | Freq: Every day | TRANSDERMAL | Status: DC
  Administered 2016-02-14 – 2016-02-17 (×4): 14 mg via TRANSDERMAL
  Filled 2016-02-14 (×4): qty 1

## 2016-02-14 MED ORDER — POTASSIUM CHLORIDE 10 MEQ/100ML IV SOLN
10.0000 meq | INTRAVENOUS | Status: AC
  Administered 2016-02-14 (×2): 10 meq via INTRAVENOUS
  Filled 2016-02-14 (×3): qty 100

## 2016-02-14 MED ORDER — ACETAMINOPHEN 325 MG PO TABS
650.0000 mg | ORAL_TABLET | Freq: Four times a day (QID) | ORAL | Status: DC | PRN
  Administered 2016-02-14: 650 mg via ORAL
  Filled 2016-02-14: qty 2

## 2016-02-14 MED ORDER — POTASSIUM CHLORIDE 10 MEQ/100ML IV SOLN: 10.0000 meq | Freq: Once | INTRAVENOUS | Status: DC

## 2016-02-14 MED ORDER — ACETAMINOPHEN 650 MG RE SUPP
650.0000 mg | Freq: Four times a day (QID) | RECTAL | Status: DC | PRN
Start: 2016-02-14 — End: 2016-02-17

## 2016-02-14 MED ORDER — ONDANSETRON HCL 4 MG/2ML IJ SOLN: 4.0000 mg | Freq: Four times a day (QID) | INTRAMUSCULAR | Status: DC | PRN

## 2016-02-14 MED ORDER — SODIUM CHLORIDE 0.9 % IV BOLUS (SEPSIS)
1000.0000 mL | Freq: Once | INTRAVENOUS | Status: AC
Start: 2016-02-14 — End: 2016-02-14
  Administered 2016-02-14: 1000 mL via INTRAVENOUS

## 2016-02-14 NOTE — Progress Notes (Signed)
Entered in d/c instructions  Please use the resources provided to you in emergency room by case manager to assist you're your choice of doctor for follow up A referral for you has been sent to [facility] if you have not received a call in 3 days you may contact them Call Karen Andrianos at [phone] Tuesday-Friday [email or web address] These [place] uninsured resources provide possible primary care providers, resources for discounted medications, housing, dental resources, affordable care act information, plus other resources for [place]   

## 2016-02-14 NOTE — ED Notes (Signed)
Per EMS- patient was found unconscious on the side of the road. Patient was found with no respirations. Patient was given Narcan 1 mg Iv prior to arrival tot he ED. Patient awake and oriented.

## 2016-02-14 NOTE — ED Notes (Signed)
Patient frequently takes O2/ETCO2 detector off.

## 2016-02-14 NOTE — ED Notes (Signed)
Patient lethargic and hard to arouse. Sats 73% with O2 4L/min via [place]. EDP notified.

## 2016-02-14 NOTE — ED Notes (Signed)
Patient denies SI/HI/ Patient states this is the first time using heroin since being out of prison. Patient lethargic, but awakens easily with verbal stimuli.

## 2016-02-14 NOTE — Progress Notes (Signed)
CM spoke with pt who [place]cp.  CM discussed and provided written information to assist pt with determining choice for uninsured accepting pcps, discussed the importance of pcp vs EDP services for f/u care, www.needymeds.org, www.goodrx.com, discounted pharmacies and other [place] resources such as [facility] , [facility], affordable care act, financial assistance, uninsured dental services, [place] med assist, DSS and  health department  Reviewed resources for [place] uninsured accepting pcps like [facility], family medicine at [address], [facility], [facility], local urgent care centers, [facility], general medical clinics, [facility], [facility] plus others, medication resources, [facility] out patient pharmacies and housing Pt voiced understanding and appreciation of resources provided   Provided [facility] contact information Pt agreed to a referral Cm completed referral Pt to be contact by [facility] clinical liason Pt with 1 ED visit and no admissions in the last 6 months  Pt alert and oriented during CM visit with him He was able to tell CM that [place], [facility] and does not have a pcp or coverage CM placed items discussed with pt in a pt belonging bag that was placed beside of his black and gray book bag sitting in the floor near the sink [facility] rm [facility]3

## 2016-02-14 NOTE — ED Notes (Signed)
Bed: [id number] Expected date:  Expected time:  Means of arrival:  Comments: EMS- 44yo M, OD/narcan given

## 2016-02-14 NOTE — H&P (Signed)
History and Physical    GENERAL WEARING ZOX:[id number] DOB: Jun 06, 1971 DOA: 02/14/2016  Referring Provider: Dr. Dalene Seltzer PCP: No PCP Per Patient  Outpatient Specialists:  None   Patient coming from: home  Chief Complaint: hypoxia and unintentional overdose.  HPI: Joseph Spence is a 45 y.o. male with PMH significant for with PMH significant for tobacco abuse, heroin abuse and depression; who was brought to ED after found unresponsive on the street. Patient endorses use of heroin prior to event of passing out. Patient was Hypoxic initially and required oxygen supplementation and Narcan. No CP, no fever, no chills, no nausea, no vomiting, no HA's or sphincters incontinence.     ED Course: patient CXR w/o acute infiltrates. Excellent response to Narcan and required narcan drip. IVF's given and [facility] called to admit patient for further evaluation and treatment.  Review of Systems:  All other systems reviewed and apart from HPI, are negative.  Past Medical History  Diagnosis Date  . MRSA (methicillin resistant staph aureus) culture positive   . RLS (restless legs syndrome)   . Heroin abuse   . Hep C w/o coma, chronic (HCC)     Past Surgical History  Procedure Laterality Date  . Appendectomy    . Hernia repair       reports that he has been smoking Cigarettes.  He has been smoking about 1.00 pack per day. He does not have any smokeless tobacco history on file. He reports that he drinks alcohol. He reports that he uses illicit drugs.  Allergies  Allergen Reactions  . Vicodin [Hydrocodone-Acetaminophen] Itching    Family History  Problem Relation Age of Onset  . Lymphoma Father      Prior to Admission medications   Medication Sig Start Date End Date Taking? Authorizing Provider  sertraline (ZOLOFT) 50 MG tablet Take 50 mg by mouth daily.   Yes Historical Provider, MD    Physical Exam: Filed Vitals:   02/14/16 1302 02/14/16 1315 02/14/16 1327 02/14/16 1400  BP:   101/70 101/70 98/70  Pulse:   48 53  Temp: 98.1 F (36.7 C)     TempSrc: Oral     Resp:   9 8  Height: 6' (1.829 m)     Weight: 75.7 kg (166 lb 14.2 oz)     SpO2:   96% 96%    Constitutional: NAD, easily arouse with verbal stimuli; lethargic. Afebrile; no CP. Eyes: PERRLA, lids and conjunctivae normal ENMT: Mucous membranes are moist. Posterior pharynx clear of any exudate or lesions. Normal dentition.  Neck: normal, supple, no masses, no thyromegaly, no JVD Respiratory: clear to auscultation bilaterally, no wheezing, no crackles. Normal respiratory effort. No accessory muscle use.  Cardiovascular: S1 & S2 heard, regular rhythm, no murmurs / rubs / gallops. No extremity edema. 2+ pedal pulses. No carotid bruits.  Abdomen: No distension, no tenderness, no masses palpated. No hepatosplenomegaly. Bowel sounds normal.  Musculoskeletal: no clubbing / cyanosis. No joint deformity upper and lower extremities. Good ROM, no contractures. Normal muscle tone.  Skin: no rashes, lesions, ulcers. No induration Neurologic: CN 2-12 grossly intact. Sensation intact, DTR normal. Strength 5/5 in all 4 limbs.  Psychiatric: Normal judgment and insight. Alert and oriented x 3. Normal mood.    Labs on Admission: I have personally reviewed following labs and imaging studies  CBC:  Recent Labs Lab 02/14/16 0922  WBC 17.6*  NEUTROABS 14.9*  HGB 13.1  HCT 38.8*  MCV 92.8  PLT 255   Basic  Metabolic Panel:  Recent Labs Lab 02/14/16 0922  NA 140  K 3.1*  CL 108  CO2 26  GLUCOSE 129*  BUN 9  CREATININE 0.86  CALCIUM 8.0*   GFR: Estimated Creatinine Clearance: 119.8 mL/min (by C-G formula based on Cr of 0.86).   Liver Function Tests:  Recent Labs Lab 02/14/16 0922  AST 42*  ALT 37  ALKPHOS 74  BILITOT 0.6  PROT 6.8  ALBUMIN 3.8   Urine analysis:    Component Value Date/Time   COLORURINE YELLOW 03/13/2014 0000   APPEARANCEUR CLEAR 03/13/2014 0000   LABSPEC 1.010 03/13/2014 0000    PHURINE 6.5 03/13/2014 0000   GLUCOSEU NEGATIVE 03/13/2014 0000   HGBUR NEGATIVE 03/13/2014 0000   BILIRUBINUR NEGATIVE 03/13/2014 0000   KETONESUR NEGATIVE 03/13/2014 0000   PROTEINUR NEGATIVE 03/13/2014 0000   UROBILINOGEN 1.0 03/13/2014 0000   NITRITE NEGATIVE 03/13/2014 0000   LEUKOCYTESUR NEGATIVE 03/13/2014 0000    Recent Results (from the past 240 hour(s))  MRSA PCR Screening     Status: None   Collection Time: 02/14/16  1:17 PM  Result Value Ref Range Status   MRSA by PCR NEGATIVE NEGATIVE Final    Comment:        The GeneXpert MRSA Assay (FDA approved for NASAL specimens only), is one component of a comprehensive MRSA colonization surveillance program. It is not intended to diagnose MRSA infection nor to guide or monitor treatment for MRSA infections.      Radiological Exams on Admission: Dg Chest Portable 1 View  02/14/2016  CLINICAL DATA:  Heroin overdose with hypoxia EXAM: PORTABLE CHEST 1 VIEW COMPARISON:  April 05, 2014 FINDINGS: Shallow degree of inspiration. There is vessel crowding in the bases. There is no frank edema or consolidation. Heart is upper normal in size with pulmonary vascularity within normal limits. No adenopathy. No bone lesions. No pneumothorax. IMPRESSION: Shallow degree of inspiration. No edema or consolidation. Heart upper normal in size. Electronically Signed   By: Bretta BangWilliam  Woodruff III M.D.   On: 02/14/2016 09:36    EKG:  None   Assessment/Plan 1-heroin overdose: unintentional  -patient reports having hx of heroin use in the past; but was incarcerated and is the first time he used it again after been released  -excellent response to narcam -will check TSH and alcohol level -admitted to stepdown given transient hypoxia, need for narcam drip and close follow up of his resp status. -will provide O2 supplementation -continue supportive care and follow clinical response   2-Hypokalemia: -will check phosphorus and Magnesium  level -replete K as needed   3-Heroin abuse: -cessation counseling provided  4-Chronic hepatitis C (HCC): -will check Hep C RNA quantitative   5-Depression: -will continue Zoloft   6-Leukocytosis: -appears to be secondary to demargination  -no signs of infection currently; but if he spikes fever will be concern for aspiration -for now, continue supportive care, IVF's and repeat CBC in am    Time: 60 minutes   DVT prophylaxis: heparin  Code Status: Full Family Communication: no family at bedside  Disposition Plan: to be determine; but will anticipate discharge home when medically stable in 1-2 days Consults called: none  Admission status: inpatient, LOS > 2 midnights, stepdown bed     Vassie LollMadera, Petula Rotolo MD [facility] Pager [phone]  If 7PM-7AM, please contact night-coverage [email or web address] Password [email or web address]  02/14/2016, 3:04 PM

## 2016-02-14 NOTE — Clinical Social Work Note (Signed)
Clinical Social Work Assessment  Patient Details  Name: Joseph Spence MRN: [id number] Date of Birth: [birth year]  Date of referral:  02/14/16               Reason for consult:  Other (Comment Required) (Homeless issues)                Permission sought to share information with:    Permission granted to share information::     Name::        Agency::     Relationship::     Contact Information:     Housing/Transportation Living arrangements for the past 2 months:   (Patient reports he has been living with his mother in [place]) Source of Information:  Patient Patient Interpreter Needed:  None Criminal Activity/Legal Involvement Pertinent to Current Situation/Hospitalization:    Significant Relationships:    Lives with:  Other (Comment) (Patient states he lives with his mother) Do you feel safe going back to the place where you live?    Need for family participation in patient care:     Care giving concerns: Unknown at this time   Office managerocial Worker assessment / plan:  CSW spoke with patient at bedside with no family present. [facility] Patient reports he has family in [place] and he has been living with his mother off and on for the past year in [place]. Patient reports he did not want to provide names and contact numbers for family members. Patient reports he has friends in the area as well. Patient reports no use of  medical equipment. Patient requested information for Narcotics Anonymous. No other questions noted at this time. CSW went back to patient's room to provide him with the NA information to discover, patient had already been transported to inpatient.   Employment status:  Unemployed Health and safety inspectornsurance information:  Other (Comment Required) (No insurance) PT Recommendations:    Information / Referral to community resources:   (CSW went to give patient NA resources as requested, patient was already taken to  inpatient)  Patient/Family's Response to care: Unknown at this time  Patient/Family's Understanding of and Emotional Response to Diagnosis, Current Treatment, and Prognosis: Unknown at this time  Emotional Assessment Appearance:  Appears stated age Attitude/Demeanor/Rapport:    Affect (typically observed):  Accepting Orientation:  Oriented to Self, Oriented to Place, Oriented to  Time, Oriented to Situation Alcohol / Substance use:  Illicit Drugs (Herion overdose) Psych involvement (Current and /or in the community):     Discharge Needs  Concerns to be addressed:  Other (Comment Required (Unknown at this time) Readmission within the last 30 days:    Current discharge risk:  Other (Unknown at this time) Barriers to Discharge:  Other (Unknown at this time)   Claudean SeveranceLaVonia M Kalianna Verbeke, LCSW 02/14/2016, 1:19 PM

## 2016-02-14 NOTE — ED Provider Notes (Signed)
CSN: [id number]     Arrival date & time 02/14/16  0804 History   First MD Initiated Contact with Patient 02/14/16 [phone]     Chief Complaint  Patient presents with  . heroin overdose      (Consider location/radiation/quality/duration/timing/severity/associated sxs/prior Treatment) Patient is a 45 y.o. male presenting with Overdose.  Drug Overdose This is a recurrent problem. The problem occurs constantly. The problem has been gradually improving. Pertinent negatives include no chest pain, no abdominal pain, no headaches and no shortness of breath. Nothing aggravates the symptoms. Nothing relieves the symptoms. Treatments tried: narcan. The treatment provided moderate relief.     45 year old male with a history of heroin dependence and abuse presents with concern for heroin overdose. Patient was just released from incarceration, and had used heroin for the first time, with lower tolerance. He was found unresponsive along side of the road with EMS, was given 1 mg of Narcan with improvement of mental status and respirations.   Past Medical History  Diagnosis Date  . MRSA (methicillin resistant staph aureus) culture positive   . RLS (restless legs syndrome)   . Heroin abuse   . Hep C w/o coma, chronic (HCC)    Past Surgical History  Procedure Laterality Date  . Appendectomy    . Hernia repair     Family History  Problem Relation Age of Onset  . Lymphoma Father    Social History  Substance Use Topics  . Smoking status: Current Every Day Smoker -- 1.00 packs/day    Types: Cigarettes  . Smokeless tobacco: None  . Alcohol Use: Yes     Comment: occasionally    Review of Systems  Unable to perform ROS: Mental status change  Respiratory: Negative for shortness of breath.   Cardiovascular: Negative for chest pain.  Gastrointestinal: Negative for abdominal pain.  Neurological: Negative for headaches.      Allergies  Vicodin  Home Medications   Prior to Admission medications    Medication Sig Start Date End Date Taking? Authorizing Provider  sertraline (ZOLOFT) 50 MG tablet Take 50 mg by mouth daily.   Yes Historical Provider, MD   BP 102/71 mmHg  Pulse 57  Temp(Src) 98.2 F (36.8 C) (Oral)  Resp 15  Ht 6' (1.829 m)  Wt 166 lb 14.2 oz (75.7 kg)  BMI 22.63 kg/m2  SpO2 95% Physical Exam  Constitutional: He is oriented to person, place, and time. He appears well-developed and well-nourished. He appears listless. No distress.  HENT:  Head: Normocephalic and atraumatic.  Eyes: Conjunctivae and EOM are normal.  Neck: Normal range of motion.  Cardiovascular: Normal rate, regular rhythm, normal heart sounds and intact distal pulses.  Exam reveals no gallop and no friction rub.   No murmur heard. Pulmonary/Chest: Effort normal and breath sounds normal. No respiratory distress. He has no wheezes. He has no rales.  Abdominal: Soft. He exhibits no distension. There is no tenderness. There is no guarding.  Musculoskeletal: He exhibits no edema.  Neurological: He is oriented to person, place, and time. He appears listless. GCS eye subscore is 3. GCS verbal subscore is 5. GCS motor subscore is 6.  Skin: Skin is warm and dry. He is not diaphoretic.  Nursing note and vitals reviewed.   ED Course  Procedures (including critical care time) Labs Review Labs Reviewed  CBC WITH DIFFERENTIAL/PLATELET - Abnormal; Notable for the following:    WBC 17.6 (*)    RBC 4.18 (*)    HCT 38.8 (*)  Neutro Abs 14.9 (*)    Monocytes Absolute 1.6 (*)    All other components within normal limits  COMPREHENSIVE METABOLIC PANEL - Abnormal; Notable for the following:    Potassium 3.1 (*)    Glucose, Bld 129 (*)    Calcium 8.0 (*)    AST 42 (*)    All other components within normal limits  MRSA PCR SCREENING  ETHANOL  MAGNESIUM  PHOSPHORUS  TSH  ETHANOL  HEMOGLOBIN A1C  HCV RNA QUANT  COMPREHENSIVE METABOLIC PANEL  CBC    Imaging Review Dg Chest Portable 1  View  02/14/2016  CLINICAL DATA:  Heroin overdose with hypoxia EXAM: PORTABLE CHEST 1 VIEW COMPARISON:  April 05, 2014 FINDINGS: Shallow degree of inspiration. There is vessel crowding in the bases. There is no frank edema or consolidation. Heart is upper normal in size with pulmonary vascularity within normal limits. No adenopathy. No bone lesions. No pneumothorax. IMPRESSION: Shallow degree of inspiration. No edema or consolidation. Heart upper normal in size. Electronically Signed   By: Bretta Bang III M.D.   On: 02/14/2016 09:36   I have personally reviewed and evaluated these images and lab results as part of my medical decision-making.   EKG Interpretation None      MDM   Final diagnoses:  Heroin abuse  Drug overdose, accidental or unintentional, initial encounter   45 year old male with a history of heroin dependence and abuse presents with concern for heroin overdose. Patient was just released from incarceration, and had used heroin for the first time, with lower tolerance. He was found unresponsive along side of the road with EMS, was given 1 mg of Narcan with improvement of mental status and respirations. On arrival to the emergency department, patient is somnolent, however is oriented and responds to voice. Denies suicidal ideation. Patient with some hypoxia, likely secondary to bradypnea, however chest x-ray was done which showed no acute abnormalities.  Pt with improvement in saturations with improvement in respirations and at this time have low suspicion for PE or other septic emboli.  Pt with desaturation and apneic episode which responded to .  narcan. Suspect longer acting opiate. Placed on narcan gtt and admitted to stepdown unit for continued care.   Alvira Monday, MD 02/14/16 2251

## 2016-02-15 DIAGNOSIS — F329 Major depressive disorder, single episode, unspecified: Secondary | ICD-10-CM

## 2016-02-15 DIAGNOSIS — T401X1A Poisoning by heroin, accidental (unintentional), initial encounter: Principal | ICD-10-CM

## 2016-02-15 DIAGNOSIS — E876 Hypokalemia: Secondary | ICD-10-CM

## 2016-02-15 DIAGNOSIS — F1210 Cannabis abuse, uncomplicated: Secondary | ICD-10-CM

## 2016-02-15 DIAGNOSIS — F331 Major depressive disorder, recurrent, moderate: Secondary | ICD-10-CM

## 2016-02-15 DIAGNOSIS — F1110 Opioid abuse, uncomplicated: Secondary | ICD-10-CM

## 2016-02-15 DIAGNOSIS — T50901A Poisoning by unspecified drugs, medicaments and biological substances, accidental (unintentional), initial encounter: Secondary | ICD-10-CM

## 2016-02-15 DIAGNOSIS — B182 Chronic viral hepatitis C: Secondary | ICD-10-CM

## 2016-02-15 LAB — COMPREHENSIVE METABOLIC PANEL
ALK PHOS: 57 U/L (ref 38–126)
ALT: 25 U/L (ref 17–63)
AST: 24 U/L (ref 15–41)
Albumin: 3 g/dL — ABNORMAL LOW (ref 3.5–5.0)
Anion gap: 4 — ABNORMAL LOW (ref 5–15)
BUN: 5 mg/dL — ABNORMAL LOW (ref 6–20)
CALCIUM: 7.8 mg/dL — AB (ref 8.9–10.3)
CO2: 27 mmol/L (ref 22–32)
CREATININE: 0.78 mg/dL (ref 0.61–1.24)
Chloride: 108 mmol/L (ref 101–111)
Glucose, Bld: 97 mg/dL (ref 65–99)
Potassium: 3.4 mmol/L — ABNORMAL LOW (ref 3.5–5.1)
Sodium: 139 mmol/L (ref 135–145)
Total Bilirubin: 0.4 mg/dL (ref 0.3–1.2)
Total Protein: 5.4 g/dL — ABNORMAL LOW (ref 6.5–8.1)

## 2016-02-15 LAB — URINALYSIS, ROUTINE W REFLEX MICROSCOPIC
BILIRUBIN URINE: NEGATIVE
Glucose, UA: NEGATIVE mg/dL
HGB URINE DIPSTICK: NEGATIVE
KETONES UR: NEGATIVE mg/dL
Leukocytes, UA: NEGATIVE
Nitrite: NEGATIVE
PH: 7 (ref 5.0–8.0)
Protein, ur: NEGATIVE mg/dL
SPECIFIC GRAVITY, URINE: 1.009 (ref 1.005–1.030)

## 2016-02-15 LAB — RAPID URINE DRUG SCREEN, HOSP PERFORMED
Amphetamines: NOT DETECTED
BARBITURATES: NOT DETECTED
Benzodiazepines: NOT DETECTED
Cocaine: POSITIVE — AB
OPIATES: POSITIVE — AB
TETRAHYDROCANNABINOL: NOT DETECTED

## 2016-02-15 LAB — RESPIRATORY PANEL BY PCR
ADENOVIRUS-RVPPCR: NOT DETECTED
BORDETELLA PERTUSSIS-RVPCR: NOT DETECTED
CHLAMYDOPHILA PNEUMONIAE-RVPPCR: NOT DETECTED
CORONAVIRUS HKU1-RVPPCR: NOT DETECTED
CORONAVIRUS NL63-RVPPCR: NOT DETECTED
Coronavirus 229E: NOT DETECTED
Coronavirus OC43: NOT DETECTED
INFLUENZA A H3-RVPPCR: NOT DETECTED
INFLUENZA A-RVPPCR: NOT DETECTED
INFLUENZA B-RVPPCR: NOT DETECTED
Influenza A H1 2009: NOT DETECTED
Influenza A H1: NOT DETECTED
METAPNEUMOVIRUS-RVPPCR: NOT DETECTED
Mycoplasma pneumoniae: NOT DETECTED
PARAINFLUENZA VIRUS 4-RVPPCR: NOT DETECTED
Parainfluenza Virus 1: NOT DETECTED
Parainfluenza Virus 2: NOT DETECTED
Parainfluenza Virus 3: NOT DETECTED
RHINOVIRUS / ENTEROVIRUS - RVPPCR: NOT DETECTED
Respiratory Syncytial Virus: NOT DETECTED

## 2016-02-15 LAB — HCV RNA QUANT
HCV Quantitative Log: 2.699 log10 IU/mL (ref >1.70)
HCV Quantitative: 500 IU/mL (ref >50)

## 2016-02-15 LAB — CBC
HCT: 32.6 % — ABNORMAL LOW (ref 39.0–52.0)
Hemoglobin: 11 g/dL — ABNORMAL LOW (ref 13.0–17.0)
MCH: 30.6 pg (ref 26.0–34.0)
MCHC: 33.7 g/dL (ref 30.0–36.0)
MCV: 90.8 fL (ref 78.0–100.0)
PLATELETS: 232 10*3/uL (ref 150–400)
RBC: 3.59 MIL/uL — AB (ref 4.22–5.81)
RDW: 13.7 % (ref 11.5–15.5)
WBC: 10 10*3/uL (ref 4.0–10.5)

## 2016-02-15 LAB — HEMOGLOBIN A1C
HEMOGLOBIN A1C: 5.6 % (ref 4.8–5.6)
MEAN PLASMA GLUCOSE: 114 mg/dL

## 2016-02-15 LAB — MRSA PCR SCREENING: MRSA by PCR: NEGATIVE

## 2016-02-15 MED ORDER — BENZONATATE 100 MG PO CAPS
100.0000 mg | ORAL_CAPSULE | Freq: Three times a day (TID) | ORAL | Status: DC | PRN
Start: 2016-02-15 — End: 2016-02-17
  Administered 2016-02-15: 100 mg via ORAL
  Filled 2016-02-15: qty 1

## 2016-02-15 MED ORDER — ENOXAPARIN SODIUM 40 MG/0.4ML ~~LOC~~ SOLN
40.0000 mg | SUBCUTANEOUS | Status: DC
  Administered 2016-02-15 – 2016-02-16 (×2): 40 mg via SUBCUTANEOUS
  Filled 2016-02-15 (×2): qty 0.4

## 2016-02-15 MED ORDER — POTASSIUM CHLORIDE CRYS ER 20 MEQ PO TBCR
40.0000 meq | EXTENDED_RELEASE_TABLET | Freq: Once | ORAL | Status: AC
  Administered 2016-02-15: 40 meq via ORAL
  Filled 2016-02-15: qty 2

## 2016-02-15 MED ORDER — GUAIFENESIN ER 600 MG PO TB12
600.0000 mg | ORAL_TABLET | Freq: Two times a day (BID) | ORAL | Status: DC
  Administered 2016-02-15 – 2016-02-17 (×5): 600 mg via ORAL
  Filled 2016-02-15 (×5): qty 1

## 2016-02-15 NOTE — Clinical Social Work Psych Assess (Signed)
Clinical Social Work Environmental manager Social Worker:  Marshell Garfinkel Date/Time:  02/15/2016, 10:29 AM Referred By:  Physician Date Referred:    Reason for Referral:  Substance Abuse, Behavioral Health Issues   Presenting Symptoms/Problems  Presenting Symptoms/Problems(in person's/family's own words):  " I just got out of prison last Thursday, met up with an old buddy and he had a little heroine on him.  Next thing I knew I was in the EMS and at the hospital".  Patient admitted for unintentional overdose of heroine. Patient reports he has not used in over 1 year due to being in prision. Patient reports he was not trying to harm himself and unclear as to why he even used the substance.   Abuse/Neglect/Trauma History  Abuse/Neglect/Trauma History:  Denies History Abuse/Neglect/Trauma History Comments (indicate dates):  None reported or related to admission   Psychiatric History  Psychiatric History:  Inpatient/Hospitalization, Outpatient Treatment, Residential Treatment Psychiatric Medication:  Currently taking Zoloft   Current Mental Health Hospitalizations/Previous Mental Health History:  Nothing current at this time. Will be referred to [facility]   Current Provider:  none Place and Date:  none  Current Medications:   Scheduled Meds: . enoxaparin (LOVENOX) injection  40 mg Subcutaneous Q24H  . guaiFENesin  600 mg Oral BID  . nicotine  14 mg Transdermal Daily  . potassium chloride  40 mEq Oral Once  . sertraline  50 mg Oral Daily   Continuous Infusions: . naLOXone (NARCAN) adult infusion for OVERDOSE 0.25 mg/hr (02/15/16 0330)   PRN Meds:.acetaminophen **OR** acetaminophen, benzonatate, ondansetron **OR** ondansetron (ZOFRAN) IV    Previous Inpatient Admission/Date/Reason:  Patient was seen by [facility] NP and MD while in ED in 2015 for heroin detox. He was not admitted only seen and left before he could be arranged with services.   Emotional  Health/Current Symptoms  Suicide/Self Harm: None Reported Suicide Attempt in Past (date/description):  Denies, reports he loves himself and does not have plan, intent   Other Harmful Behavior (ex. homicidal ideation) (describe):  Substance use   Psychotic/Dissociative Symptoms  Psychotic/Dissociative Symptoms: None Reported Other Psychotic/Dissociative Symptoms:  None reported, none observed  Attention/Behavioral Symptoms  Attention/Behavioral Symptoms: Within Normal Limits Other Attention/Behavioral Symptoms:  Patient very calm and cooperative in assessment. He is open and forthcoming with all information regarding his most recent incarceration and substance use. Reports depression due to circumstances and life choices, but managed on zoloft   Cognitive Impairment  Cognitive Impairment:  Within Normal Limits, Orientation - Situation, Orientation - Time, Orientation - Place, Orientation - Self Other Cognitive Impairment:  none   Mood and Adjustment  Mood and Adjustment:  Mood Congruent   Stress, Anxiety, Trauma, Any Recent Loss/Stressor  Stress, Anxiety, Trauma, Any Recent Loss/Stressor: Anxiety, Grief/Loss (recent or history), Relationship Anxiety (frequency):  Patient anxious about getting back into society and the community.   Phobia (specify):  none  Compulsive Behavior (specify):  none  Obsessive Behavior (specify):  none  Other Stress, Anxiety, Trauma, Any Recent Loss/Stressor:  Patient reports he lost his fiances to a heroin overdose last year.     Substance Abuse/Use  Substance Abuse/Use: Current Substance Use SBIRT Completed (please refer for detailed history): No Self-reported Substance Use (last use and frequency):  Heroin, most recent use: this admission  Urinary Drug Screen Completed: Yes Alcohol Level:  NA   Environment/Housing/Living Arrangement  Environmental/Housing/Living Arrangement: Stable Housing Who is in the Home:   Prior to admission,  patient reports poor  housing and living with friends. Once he leaves the hospital he will being staying with a friend in [place] until he can find his own place  Emergency Contact:  Mother and Father: living in [place]:  (none at this time)  Patient reports he has plans for work starting next week.   Patient's Strengths and Goals  Patient's Strengths and Goals (patient's own words):  " I don't need to keep doing this stuff, I don't plan to keep using and just used this time because it was available. My buddy who I am going to live with does not use drugs and that will help me stay clean"   Clinical Social Worker's Interpretive Summary  Clinical Social Workers Interpretive Summary:  LCSW completed assessment with patient and explained role. Patient denies this was an intentional plan to hurt himself, but more using recreationally for the first time and used too much. Denies plans to use again. Not wanting residential SA treatment at this time, only outpatient.  Patient alert and oriented, goal driven in thought and linear in thinking.  Patient open to resources as listed below.  Will follow up as needed. Will stay with a friend at [place] and working to obtain a job. Bus pas can be given to patient if he cannot find a ride to pick him up.   Disposition  Disposition: Outpatient Referral Made/Needed, Recommend Psych CSW Continuing To Support While In The Hospital   Patient requesting NA resources and follow up with PCP and Mental Health services. LCSW going to give NA contact numbers for sponsorship Will give packet for patient to follow at [facility] for PCP and mental health.  Patient going to fill out information for orange card to obtain insurance for primary care office.  Packet given to patient Will also give [facility] information for patient in case of crisis services. Packet of general resources for the community also given for other needs as they  arise (housing, food banks, [employer], vocab rehab for job placement).  Will continue to follow and assist with any other needs for patient once seen by Psych MD.   Lane Hacker, MSW Clinical Social Work: [employer] [phone]

## 2016-02-15 NOTE — Progress Notes (Signed)
PROGRESS NOTE  Joseph Spence A Police EXB:[id number] DOB: 15-Feb-1971 DOA: 02/14/2016 PCP: No PCP Per Patient  HPI/Recap of past 24 hours:  Feeling better, denies pain,   Assessment/Plan: Active Problems:   Hypokalemia   Drug overdose   Heroin abuse   Chronic hepatitis C (HCC)   Depression   Heroin overdose  1-heroin overdose:  patient reports it is recreational use, he denies suicidal ideation, he did report he has h/o depression but better with zoloft, he reported his girlfriend passed away from overdose, he reported he was incarcerated from 04/2015 and released from the prison a week ago. patient reports having hx of heroin use in the past, but this is the first time he used herion again after been released  -he was admitted to stepdown given transient hypoxia, need for narcam drip  -better, wean narcan drip, transfer to med tele - psych consulted -TSH wnl, negative alcohol level  2-Hypokalemia: -replaced, keep K>4, mag>2   3-Heroin abuse: -cessation counseling provided -psych/behaviroral health consulted  4-Chronic hepatitis C (HCC): - Hep C RNA quantitative  Elevated, patient need to be free from recreational drug addiction before considering hepc treatment, need to follow up by pmd. -hiv serology pending  5-Depression: -will continue Zoloft   6-Leukocytosis: -likely from stress or dehydaration -no signs of infection, no fever,c cxr unremarkable -normalized with hydration  5. Cough, cxr unremarkable, , respiratory viral panel negative, mucinex  6. cigarrette smoker: smoking cessation education provided  Code Status: Full Family Communication: no family at bedside   Disposition Plan: home 5/20, patient states that he is going to live with his friend after discharge, he states his friend does not have drug use problems.   Consultants:  psych  Procedures:  none  Antibiotics:  none   Objective: BP 124/74 mmHg  Pulse 61  Temp(Src) 98.3 F (36.8 C)  (Oral)  Resp 15  Ht 6' (1.829 m)  Wt 75.7 kg (166 lb 14.2 oz)  BMI 22.63 kg/m2  SpO2 98%  Intake/Output Summary (Last 24 hours) at 02/15/16 1316 Last data filed at 02/15/16 1200  Gross per 24 hour  Intake 2043.18 ml  Output   1475 ml  Net 568.18 ml   Filed Weights   02/14/16 1302  Weight: 75.7 kg (166 lb 14.2 oz)    Exam:   General:  NAD  Cardiovascular: RRR  Respiratory: CTABL  Abdomen: Soft/ND/NT, positive BS  Musculoskeletal: No Edema  Neuro: aaox3  Data Reviewed: Basic Metabolic Panel:  Recent Labs Lab 02/14/16 0922 02/14/16 1520 02/15/16 0317  NA 140  --  139  K 3.1*  --  3.4*  CL 108  --  108  CO2 26  --  27  GLUCOSE 129*  --  97  BUN 9  --  5*  CREATININE 0.86  --  0.78  CALCIUM 8.0*  --  7.8*  MG  --  1.8  --   PHOS  --  2.5  --    Liver Function Tests:  Recent Labs Lab 02/14/16 0922 02/15/16 0317  AST 42* 24  ALT 37 25  ALKPHOS 74 57  BILITOT 0.6 0.4  PROT 6.8 5.4*  ALBUMIN 3.8 3.0*   No results for input(s): LIPASE, AMYLASE in the last 168 hours. No results for input(s): AMMONIA in the last 168 hours. CBC:  Recent Labs Lab 02/14/16 0922 02/15/16 0317  WBC 17.6* 10.0  NEUTROABS 14.9*  --   HGB 13.1 11.0*  HCT 38.8* 32.6*  MCV 92.8  90.8  PLT 255 232   Cardiac Enzymes:   No results for input(s): CKTOTAL, CKMB, CKMBINDEX, TROPONINI in the last 168 hours. BNP (last 3 results) No results for input(s): BNP in the last 8760 hours.  ProBNP (last 3 results) No results for input(s): PROBNP in the last 8760 hours.  CBG: No results for input(s): GLUCAP in the last 168 hours.  Recent Results (from the past 240 hour(s))  MRSA PCR Screening     Status: None   Collection Time: 02/14/16  1:17 PM  Result Value Ref Range Status   MRSA by PCR NEGATIVE NEGATIVE Final    Comment:        The GeneXpert MRSA Assay (FDA approved for NASAL specimens only), is one component of a comprehensive MRSA colonization surveillance program. It  is not intended to diagnose MRSA infection nor to guide or monitor treatment for MRSA infections.      Studies: No results found.  Scheduled Meds: . enoxaparin (LOVENOX) injection  40 mg Subcutaneous Q24H  . guaiFENesin  600 mg Oral BID  . nicotine  14 mg Transdermal Daily  . sertraline  50 mg Oral Daily    Continuous Infusions: . naLOXone (NARCAN) adult infusion for OVERDOSE 0.25 mg/hr (02/15/16 0330)     Time spent:  Jasiyah Paulding MD, PhD  [facility] Pager [phone]. If 7PM-7AM, please contact night-coverage at [email or web address], password [facility] 02/15/2016, 1:16 PM  LOS: 1 day

## 2016-02-15 NOTE — Progress Notes (Signed)
This Clinical research associatewriter was contacted by Dr. Roda ShuttersXu about a psych consult and it was documented in the log.  This consult will be given to on call psychiatrist once available.      Maryelizabeth Rowanressa Olis Viverette, MSW, Clare CharonLCSW, LCAS [facility] Triage Specialist [phone]

## 2016-02-15 NOTE — Consult Note (Signed)
[facility] Consult   Reason for Consult:  Opioid abuse vs intentional overdose Referring Physician:  Dr. Erlinda Hong Patient Identification: Joseph Spence MRN:  [id number] Principal Diagnosis: <principal problem not specified> Diagnosis:   Patient Active Problem List   Diagnosis Date Noted  . Drug overdose [T50.901A] 02/14/2016  . Heroin abuse [F11.10] 02/14/2016  . Chronic hepatitis C ([place]) [B18.2] 02/14/2016  . Depression [F32.9] 02/14/2016  . Heroin overdose [T40.1X1A] 02/14/2016  . Heroin dependence ([place]) [F11.20] 05/08/2014  . Abscess of buttock, left [L02.31] 03/13/2014  . Fever [R50.9] 03/12/2014  . Abscess, gluteal, left [L02.31] 03/12/2014  . Sepsis affecting skin [L02.91] 03/12/2014  . Hypokalemia [E87.6] 03/12/2014  . Cellulitis of right hand [L03.113] 03/12/2014  . Cellulitis of right little finger [L03.011] 03/12/2014    Total Time spent with patient: 1 hour  Subjective:   Joseph Spence is a 45 y.o. male patient admitted with opioid abuse vs intentional overdose.  HPI:  Joseph Spence is a 45 y.o. Male, seen, chart reviewed and case discussed withpsychiatric LCSW. Patient reportedly suffering with depression since his fiance died with heroin overdose 9 months ago, And he has been spent 9 months incarceration for violation of probation in [place] and released last Thursday. Patient reportedly seen psychiatrists while in the incarceration and received medications Zoloft. Patient is also reportedly suffering with heroin Abuse over 2-3 years but free from abuse since he was incarcerated. Patient endorses being hospitalized for heroin intoxication but denied any withdrawal symptoms during this evaluation. Patient denies current symptoms mania, psychos and also denies active suicidal/homicidal ideation or plans. Patient has no previous history of suicidal attempt or behavioral health Hospital Admissions. Patient stated that he loves himself, he  has a 30 years old daughter and also biological parents who he wanted to live for. Patient is also willing to participate outpatient medication management, substance a and support groups as recommended by psychiatric social service. Patient is also Feeling regrets for abusing opiates and marijuana. Patient stated he has a job to go and will be released from the hospital. He worked for A guy who works for [employer] over twenty years.    Medical history: Patientwith PMH significant for with PMH significant for tobacco abuse, heroin abuse and depression; who was brought to ED after found unresponsive on the street. Patient endorses use of heroin prior to event of passing out. Patient was Hypoxic initially and required oxygen supplementation and Narcan. No CP, no fever, no chills, no nausea, no vomiting, no HA's or sphincters incontinence.   ED Course: patient CXR w/o acute infiltrates. Excellent response to Narcan and required narcan drip. IVF's given and [facility] called to admit patient for further evaluation and treatment.  Past Psychiatric History: Heroin abuse.  Risk to Self: Is patient at risk for suicide?: No Risk to Others:   Prior Inpatient Therapy:   Prior Outpatient Therapy:    Past Medical History:  Past Medical History  Diagnosis Date  . MRSA (methicillin resistant staph aureus) culture positive   . RLS (restless legs syndrome)   . Heroin abuse   . Hep C w/o coma, chronic (HCC)     Past Surgical History  Procedure Laterality Date  . Appendectomy    . Hernia repair     Family History:  Family History  Problem Relation Age of Onset  . Lymphoma Father    Family Psychiatric  History: unknown. Social History:  History  Alcohol Use  . Yes  Comment: occasionally     History  Drug Use  . Yes    Comment: heroin    Social History   Social History  . Marital Status: Divorced    Spouse Name: N/A  . Number of Children: N/A  . Years of Education: N/A   Social History  Main Topics  . Smoking status: Current Every Day Smoker -- 1.00 packs/day    Types: Cigarettes  . Smokeless tobacco: None  . Alcohol Use: Yes     Comment: occasionally  . Drug Use: Yes     Comment: heroin  . Sexual Activity: Yes     Comment: heroin   Other Topics Concern  . None   Social History Narrative   Additional Social History:    Allergies:   Allergies  Allergen Reactions  . Vicodin [Hydrocodone-Acetaminophen] Itching    Labs:  Results for orders placed or performed during the hospital encounter of 02/14/16 (from the past 48 hour(s))  CBC with Differential     Status: Abnormal   Collection Time: 02/14/16  9:22 AM  Result Value Ref Range   WBC 17.6 (H) 4.0 - 10.5 K/uL   RBC 4.18 (L) 4.22 - 5.81 MIL/uL   Hemoglobin 13.1 13.0 - 17.0 g/dL   HCT 38.8 (L) 39.0 - 52.0 %   MCV 92.8 78.0 - 100.0 fL   MCH 31.3 26.0 - 34.0 pg   MCHC 33.8 30.0 - 36.0 g/dL   RDW 13.8 11.5 - 15.5 %   Platelets 255 150 - 400 K/uL   Neutrophils Relative % 85 %   Neutro Abs 14.9 (H) 1.7 - 7.7 K/uL   Lymphocytes Relative 6 %   Lymphs Abs 1.1 0.7 - 4.0 K/uL   Monocytes Relative 9 %   Monocytes Absolute 1.6 (H) 0.1 - 1.0 K/uL   Eosinophils Relative 0 %   Eosinophils Absolute 0.0 0.0 - 0.7 K/uL   Basophils Relative 0 %   Basophils Absolute 0.0 0.0 - 0.1 K/uL  Comprehensive metabolic panel     Status: Abnormal   Collection Time: 02/14/16  9:22 AM  Result Value Ref Range   Sodium 140 135 - 145 mmol/L   Potassium 3.1 (L) 3.5 - 5.1 mmol/L   Chloride 108 101 - 111 mmol/L   CO2 26 22 - 32 mmol/L   Glucose, Bld 129 (H) 65 - 99 mg/dL   BUN 9 6 - 20 mg/dL   Creatinine, Ser 0.86 0.61 - 1.24 mg/dL   Calcium 8.0 (L) 8.9 - 10.3 mg/dL   Total Protein 6.8 6.5 - 8.1 g/dL   Albumin 3.8 3.5 - 5.0 g/dL   AST 42 (H) 15 - 41 U/L   ALT 37 17 - 63 U/L   Alkaline Phosphatase 74 38 - 126 U/L   Total Bilirubin 0.6 0.3 - 1.2 mg/dL   GFR calc non Af Amer >60 >60 mL/min   GFR calc Af Amer >60 >60 mL/min     Comment: (NOTE) The eGFR has been calculated using the CKD EPI equation. This calculation has not been validated in all clinical situations. eGFR's persistently <60 mL/min signify possible Chronic Kidney Disease.    Anion gap 6 5 - 15  Ethanol     Status: None   Collection Time: 02/14/16  9:22 AM  Result Value Ref Range   Alcohol, Ethyl (B) <5 <5 mg/dL    Comment:        LOWEST DETECTABLE LIMIT FOR SERUM ALCOHOL IS 5 mg/dL FOR MEDICAL  PURPOSES ONLY   MRSA PCR Screening     Status: None   Collection Time: 02/14/16  1:17 PM  Result Value Ref Range   MRSA by PCR NEGATIVE NEGATIVE    Comment:        The GeneXpert MRSA Assay (FDA approved for NASAL specimens only), is one component of a comprehensive MRSA colonization surveillance program. It is not intended to diagnose MRSA infection nor to guide or monitor treatment for MRSA infections.   Magnesium     Status: None   Collection Time: 02/14/16  3:20 PM  Result Value Ref Range   Magnesium 1.8 1.7 - 2.4 mg/dL  Phosphorus     Status: None   Collection Time: 02/14/16  3:20 PM  Result Value Ref Range   Phosphorus 2.5 2.5 - 4.6 mg/dL  TSH     Status: None   Collection Time: 02/14/16  3:20 PM  Result Value Ref Range   TSH 0.454 0.350 - 4.500 uIU/mL  Hemoglobin A1c     Status: None   Collection Time: 02/14/16  3:20 PM  Result Value Ref Range   Hgb A1c MFr Bld 5.6 4.8 - 5.6 %    Comment: (NOTE)         Pre-diabetes: 5.7 - 6.4         Diabetes: >6.4         Glycemic control for adults with diabetes: <7.0    Mean Plasma Glucose 114 mg/dL    Comment: (NOTE) Performed At: [facility] [place], [place] [id number] Lindon Romp MD SA:[id number]   Ethanol     Status: None   Collection Time: 02/14/16  3:20 PM  Result Value Ref Range   Alcohol, Ethyl (B) <5 <5 mg/dL    Comment:        LOWEST DETECTABLE LIMIT FOR SERUM ALCOHOL IS 5 mg/dL FOR MEDICAL PURPOSES ONLY   Comprehensive metabolic panel      Status: Abnormal   Collection Time: 02/15/16  3:17 AM  Result Value Ref Range   Sodium 139 135 - 145 mmol/L   Potassium 3.4 (L) 3.5 - 5.1 mmol/L   Chloride 108 101 - 111 mmol/L   CO2 27 22 - 32 mmol/L   Glucose, Bld 97 65 - 99 mg/dL   BUN 5 (L) 6 - 20 mg/dL   Creatinine, Ser 0.78 0.61 - 1.24 mg/dL   Calcium 7.8 (L) 8.9 - 10.3 mg/dL   Total Protein 5.4 (L) 6.5 - 8.1 g/dL   Albumin 3.0 (L) 3.5 - 5.0 g/dL   AST 24 15 - 41 U/L   ALT 25 17 - 63 U/L   Alkaline Phosphatase 57 38 - 126 U/L   Total Bilirubin 0.4 0.3 - 1.2 mg/dL   GFR calc non Af Amer >60 >60 mL/min   GFR calc Af Amer >60 >60 mL/min    Comment: (NOTE) The eGFR has been calculated using the CKD EPI equation. This calculation has not been validated in all clinical situations. eGFR's persistently <60 mL/min signify possible Chronic Kidney Disease.    Anion gap 4 (L) 5 - 15  CBC     Status: Abnormal   Collection Time: 02/15/16  3:17 AM  Result Value Ref Range   WBC 10.0 4.0 - 10.5 K/uL   RBC 3.59 (L) 4.22 - 5.81 MIL/uL   Hemoglobin 11.0 (L) 13.0 - 17.0 g/dL   HCT 32.6 (L) 39.0 - 52.0 %   MCV 90.8 78.0 - 100.0  fL   MCH 30.6 26.0 - 34.0 pg   MCHC 33.7 30.0 - 36.0 g/dL   RDW 13.7 11.5 - 15.5 %   Platelets 232 150 - 400 K/uL    Current Facility-Administered Medications  Medication Dose Route Frequency Provider Last Rate Last Dose  . acetaminophen (TYLENOL) tablet 650 mg  650 mg Oral Q6H PRN Barton Dubois, MD   650 mg at 02/14/16 1701   Or  . acetaminophen (TYLENOL) suppository 650 mg  650 mg Rectal Q6H PRN Barton Dubois, MD      . benzonatate (TESSALON) capsule 100 mg  100 mg Oral TID PRN Florencia Reasons, MD      . enoxaparin (LOVENOX) injection 40 mg  40 mg Subcutaneous Q24H Florencia Reasons, MD      . guaiFENesin [facility]) 12 hr tablet 600 mg  600 mg Oral BID Florencia Reasons, MD      . naloxone [facility]) 4 mg in dextrose 5 % 250 mL infusion  0.25 mg/hr Intravenous Continuous Gareth Morgan, MD 15.6 mL/hr at 02/15/16 0330 0.25 mg/hr at  02/15/16 0330  . nicotine (NICODERM CQ - dosed in mg/24 hours) patch 14 mg  14 mg Transdermal Daily Barton Dubois, MD   14 mg at 02/15/16 0900  . ondansetron (ZOFRAN) tablet 4 mg  4 mg Oral Q6H PRN Barton Dubois, MD       Or  . ondansetron [facility]) injection 4 mg  4 mg Intravenous Q6H PRN Barton Dubois, MD      . potassium chloride SA (K-DUR,KLOR-CON) CR tablet 40 mEq  40 mEq Oral Once Florencia Reasons, MD      . sertraline (ZOLOFT) tablet 50 mg  50 mg Oral Daily Barton Dubois, MD   50 mg at 02/15/16 0901    Musculoskeletal: Strength & Muscle Tone: decreased Gait & Station: unable to stand Patient leans: N/A  Psychiatric Specialty Exam: ROS  No Fever-chills, No Headache, No changes with Vision or hearing, reports vertigo No problems swallowing food or Liquids, No Chest pain, Cough or Shortness of Breath, No Abdominal pain, No Nausea or Vommitting, Bowel movements are regular, No Blood in stool or Urine, No dysuria, No new skin rashes or bruises, No new joints pains-aches,  No new weakness, tingling, numbness in any extremity, No recent weight gain or loss, No polyuria, polydypsia or polyphagia,   A full 10 point Review of Systems was done, except as stated above, all other Review of Systems were negative.  Blood pressure 124/74, pulse 61, temperature 98.3 F (36.8 C), temperature source Oral, resp. rate 15, height 6' (1.829 m), weight 75.7 kg (166 lb 14.2 oz), SpO2 98 %.Body mass index is 22.63 kg/(m^2).  General Appearance: Guarded  Eye Contact::  Good  Speech:  Clear and Coherent  Volume:  Decreased  Mood:  Depressed  Affect:  Appropriate and Congruent  Thought Process:  Coherent and Goal Directed  Orientation:  Full (Time, Place, and Person)  Thought Content:  WDL  Suicidal Thoughts:  No  Homicidal Thoughts:  No  Memory:  Immediate;   Good Recent;   Fair Remote;   Fair  Judgement:  Impaired  Insight:  Fair  Psychomotor Activity:  Decreased  Concentration:  Fair  Recall:   Good  Fund of Knowledge:Good  Language: Good  Akathisia:  Negative  Handed:  Right  AIMS (if indicated):     Assets:  Communication Skills Desire for Improvement Housing Leisure Time Physical Health Resilience Social Support Transportation  ADL's:  Intact  Cognition: WNL  Sleep:      Treatment Plan Summary: Patient presented with a status post intentional opioid and cannabis intoxication to get high And also has ongoing symptoms of depression.  Patient has denied acute suicidal/homicidal ideation, intention or plans.Patient has no previous history of acute suicidal ideation or attempts.  Patient is willing to follow up with outpatient medication management and continue taking Zoloft as prescribed by psychiatrist in the prison system.  Monitor for opiate withdrawal symptoms while in the hospital.  Patient does not meet criteria for acute psychiatric hospitalization and will be referred to the outpatient medication management.  Disposition: Patient does not meet criteria for psychiatric inpatient admission. Supportive therapy provided about ongoing stressors.  Durward Parcel., MD 02/15/2016 10:46 AM

## 2016-02-16 LAB — BASIC METABOLIC PANEL
ANION GAP: 6 (ref 5–15)
BUN: 7 mg/dL (ref 6–20)
CHLORIDE: 109 mmol/L (ref 101–111)
CO2: 24 mmol/L (ref 22–32)
CREATININE: 0.7 mg/dL (ref 0.61–1.24)
Calcium: 8.1 mg/dL — ABNORMAL LOW (ref 8.9–10.3)
GFR calc non Af Amer: >60 mL/min (ref >60)
Glucose, Bld: 91 mg/dL (ref 65–99)
POTASSIUM: 3.3 mmol/L — AB (ref 3.5–5.1)
Sodium: 139 mmol/L (ref 135–145)

## 2016-02-16 LAB — HIV ANTIBODY (ROUTINE TESTING W REFLEX): HIV Screen 4th Generation wRfx: NONREACTIVE

## 2016-02-16 LAB — MAGNESIUM: MAGNESIUM: 1.8 mg/dL (ref 1.7–2.4)

## 2016-02-16 MED ORDER — POTASSIUM CHLORIDE CRYS ER 20 MEQ PO TBCR
40.0000 meq | EXTENDED_RELEASE_TABLET | Freq: Once | ORAL | Status: AC
  Administered 2016-02-16: 40 meq via ORAL
  Filled 2016-02-16: qty 2

## 2016-02-16 NOTE — Progress Notes (Signed)
PROGRESS NOTE  Joseph Spence ZOX:[id number] DOB: 06/20/1971 DOA: 02/14/2016 PCP: No PCP Per Patient  HPI/Recap of past 24 hours:  Feeling better, denies pain, off narcan drip  Assessment/Plan: Active Problems:   Hypokalemia   Drug overdose   Heroin abuse   Chronic hepatitis C (HCC)   Depression   Heroin overdose  1-heroin overdose:  patient reports it is recreational use, he denies suicidal ideation, he did report he has h/o depression but better with zoloft, he reported his girlfriend passed away from overdose, he reported he was incarcerated from 04/2015 and released from the prison a week ago. patient reports having hx of heroin use in the past, but this is the first time he used herion again after been released  -he was admitted to stepdown given transient hypoxia, required narcam drip  -better, wean narcan drip, transfer to med tele - psych consulted -TSH wnl, negative alcohol level  2-Hypokalemia: -replaced, keep K>4, mag>2   3-Heroin abuse: -cessation counseling provided -psych/behaviroral health consulted  4-Chronic hepatitis C (HCC): - Hep C RNA quantitative  Elevated, patient need to be free from recreational drug addiction before considering hepc treatment, need to follow up by pmd. -hiv serology pending  5-Depression: -will continue Zoloft   6-Leukocytosis: -likely from stress or dehydaration -no signs of infection, no fever,c cxr unremarkable -normalized with hydration  5. Cough, cxr unremarkable, , respiratory viral panel negative, mucinex  6. cigarrette smoker: smoking cessation education provided  Code Status: Full Family Communication: no family at bedside   Disposition Plan: home 5/21, patient states that he is going to live with his friend after discharge, he states his friend does not have drug use problems.   Consultants:  psych  Procedures:  none  Antibiotics:  none   Objective: BP 130/71 mmHg  Pulse 57  Temp(Src) 97.9  F (36.6 C) (Oral)  Resp 17  Ht  (1.854 m)  Wt 74.39 kg (164 lb)  BMI 21.64 kg/m2  SpO2 98%  Intake/Output Summary (Last 24 hours) at 02/16/16 1845 Last data filed at 02/16/16 1700  Gross per 24 hour  Intake 1020.08 ml  Output   2750 ml  Net -1729.92 ml   Filed Weights   02/14/16 1302 02/16/16 1228  Weight: 75.7 kg (166 lb 14.2 oz) 74.39 kg (164 lb)    Exam:   General:  NAD  Cardiovascular: RRR  Respiratory: CTABL  Abdomen: Soft/ND/NT, positive BS  Musculoskeletal: No Edema  Neuro: aaox3  Data Reviewed: Basic Metabolic Panel:  Recent Labs Lab 02/14/16 0922 02/14/16 1520 02/15/16 0317 02/16/16 0308  NA 140  --  139 139  K 3.1*  --  3.4* 3.3*  CL 108  --  108 109  CO2 26  --  27 24  GLUCOSE 129*  --  97 91  BUN 9  --  5* 7  CREATININE 0.86  --  0.78 0.70  CALCIUM 8.0*  --  7.8* 8.1*  MG  --  1.8  --  1.8  PHOS  --  2.5  --   --    Liver Function Tests:  Recent Labs Lab 02/14/16 0922 02/15/16 0317  AST 42* 24  ALT 37 25  ALKPHOS 74 57  BILITOT 0.6 0.4  PROT 6.8 5.4*  ALBUMIN 3.8 3.0*   No results for input(s): LIPASE, AMYLASE in the last 168 hours. No results for input(s): AMMONIA in the last 168 hours. CBC:  Recent Labs Lab 02/14/16 604 142 2409 02/15/16 1478  WBC 17.6* 10.0  NEUTROABS 14.9*  --   HGB 13.1 11.0*  HCT 38.8* 32.6*  MCV 92.8 90.8  PLT 255 232   Cardiac Enzymes:   No results for input(s): CKTOTAL, CKMB, CKMBINDEX, TROPONINI in the last 168 hours. BNP (last 3 results) No results for input(s): BNP in the last 8760 hours.  ProBNP (last 3 results) No results for input(s): PROBNP in the last 8760 hours.  CBG: No results for input(s): GLUCAP in the last 168 hours.  Recent Results (from the past 240 hour(s))  MRSA PCR Screening     Status: None   Collection Time: 02/14/16  1:17 PM  Result Value Ref Range Status   MRSA by PCR NEGATIVE NEGATIVE Final    Comment:        The GeneXpert MRSA Assay (FDA approved for NASAL  specimens only), is one component of a comprehensive MRSA colonization surveillance program. It is not intended to diagnose MRSA infection nor to guide or monitor treatment for MRSA infections.   Respiratory Panel by PCR     Status: None   Collection Time: 02/15/16 11:24 AM  Result Value Ref Range Status   Adenovirus NOT DETECTED NOT DETECTED Final   Coronavirus 229E NOT DETECTED NOT DETECTED Final   Coronavirus HKU1 NOT DETECTED NOT DETECTED Final   Coronavirus NL63 NOT DETECTED NOT DETECTED Final   Coronavirus OC43 NOT DETECTED NOT DETECTED Final   Metapneumovirus NOT DETECTED NOT DETECTED Final   Rhinovirus / Enterovirus NOT DETECTED NOT DETECTED Final   Influenza A NOT DETECTED NOT DETECTED Final   Influenza A H1 NOT DETECTED NOT DETECTED Final   Influenza A H1 2009 NOT DETECTED NOT DETECTED Final   Influenza A H3 NOT DETECTED NOT DETECTED Final   Influenza B NOT DETECTED NOT DETECTED Final   Parainfluenza Virus 1 NOT DETECTED NOT DETECTED Final   Parainfluenza Virus 2 NOT DETECTED NOT DETECTED Final   Parainfluenza Virus 3 NOT DETECTED NOT DETECTED Final   Parainfluenza Virus 4 NOT DETECTED NOT DETECTED Final   Respiratory Syncytial Virus NOT DETECTED NOT DETECTED Final   Bordetella pertussis NOT DETECTED NOT DETECTED Final   Chlamydophila pneumoniae NOT DETECTED NOT DETECTED Final   Mycoplasma pneumoniae NOT DETECTED NOT DETECTED Final  MRSA PCR Screening     Status: None   Collection Time: 02/15/16 11:25 AM  Result Value Ref Range Status   MRSA by PCR NEGATIVE NEGATIVE Final    Comment:        The GeneXpert MRSA Assay (FDA approved for NASAL specimens only), is one component of a comprehensive MRSA colonization surveillance program. It is not intended to diagnose MRSA infection nor to guide or monitor treatment for MRSA infections.      Studies: No results found.  Scheduled Meds: . enoxaparin (LOVENOX) injection  40 mg Subcutaneous Q24H  . guaiFENesin  600  mg Oral BID  . nicotine  14 mg Transdermal Daily  . sertraline  50 mg Oral Daily    Continuous Infusions: . naLOXone (NARCAN) adult infusion for OVERDOSE Stopped (02/16/16 [id number])     Time spent: 15mins  Brentley Landfair MD, PhD  [facility] Pager [phone]. If 7PM-7AM, please contact night-coverage at [email or web address], password [email or web address] 02/16/2016, 6:45 PM  LOS: 2 days

## 2016-02-17 LAB — BASIC METABOLIC PANEL
ANION GAP: 5 (ref 5–15)
BUN: 8 mg/dL (ref 6–20)
CHLORIDE: 108 mmol/L (ref 101–111)
CO2: 27 mmol/L (ref 22–32)
Calcium: 8.4 mg/dL — ABNORMAL LOW (ref 8.9–10.3)
Creatinine, Ser: 0.82 mg/dL (ref 0.61–1.24)
GFR calc non Af Amer: >60 mL/min (ref >60)
GLUCOSE: 97 mg/dL (ref 65–99)
Potassium: 3.6 mmol/L (ref 3.5–5.1)
Sodium: 140 mmol/L (ref 135–145)

## 2016-02-17 LAB — MAGNESIUM: Magnesium: 1.9 mg/dL (ref 1.7–2.4)

## 2016-02-17 NOTE — Progress Notes (Signed)
Please see note by myself on 5/19. All documents have been provided and patient has been educated. He is cleared from psych and social work standpoint for discharge. No other needs.  Deretha EmoryHannah Jadesola Poynter LCSW, MSW Clinical Social Work: [employer] [phone]

## 2016-02-17 NOTE — Care Management (Signed)
CM spoke with patient at the bedside. Patient states he has a supply of Zoloft to take when discharged. Reports he does not need any other medication assistance at this time. Physicist, medicalCrystal Fallon Haecker RN BSN CCM

## 2016-02-17 NOTE — Discharge Summary (Signed)
Discharge Summary  [name]:[id number] DOB: 08/11/1971  PCP: No PCP Per Patient  Admit date: 02/14/2016 Discharge date: 02/17/2016  Time spent: <64mins  Recommendations for Outpatient Follow-up:  1. F/u with PMD at [facility] within a week  for hospital discharge follow up, repeat cbc/bmp at follow up  Discharge Diagnoses:  Active Hospital Problems   Diagnosis Date Noted  . Drug overdose 02/14/2016  . Heroin abuse 02/14/2016  . Chronic hepatitis C (HCC) 02/14/2016  . Depression 02/14/2016  . Heroin overdose 02/14/2016  . Hypokalemia 03/12/2014    Resolved Hospital Problems   Diagnosis Date Noted Date Resolved  No resolved problems to display.    Discharge Condition: stable  Diet recommendation: regular diet  Filed Weights   02/14/16 1302 02/16/16 1228  Weight: 75.7 kg (166 lb 14.2 oz) 74.39 kg (164 lb)    History of present illness:   Chief Complaint: hypoxia and unintentional overdose.  HPI: Joseph Spence is a 45 y.o. male with PMH significant for with PMH significant for tobacco abuse, heroin abuse and depression; who was brought to ED after found unresponsive on the street. Patient endorses use of heroin prior to event of passing out. Patient was Hypoxic initially and required oxygen supplementation and Narcan. No CP, no fever, no chills, no nausea, no vomiting, no HA's or sphincters incontinence.   ED Course: patient CXR w/o acute infiltrates. Excellent response to Narcan and required narcan drip. IVF's given and [facility] called to admit patient for further evaluation and treatment.  Hospital Course:  Active Problems:   Hypokalemia   Drug overdose   Heroin abuse   Chronic hepatitis C (HCC)   Depression   Heroin overdose  1-heroin overdose:  patient reports it is recreational use, he denies suicidal ideation, he did report he has h/o depression but better with zoloft, he reported his girlfriend passed away  from overdose, he reported he was incarcerated from 04/2015 and released from the prison a week ago. patient reports having hx of heroin use in the past, but this is the first time he used herion again after been released  -he was admitted to stepdown given transient hypoxia, required narcam drip  -resolved, off narcan drip, transfer to med tele - psych consulted -TSH wnl, negative alcohol level  2-Hypokalemia: -replaced, keep K>4, mag>2   3-Heroin abuse: -cessation counseling provided -psych/behaviroral health input appreciated, detail please refer to psych MD and psych social worker's note  4-Chronic hepatitis C (HCC): - Hep C RNA quantitative still Elevated, but decreased compare to 2015 value,  patient need to be free from recreational drug addiction before considering hepc treatment, need to follow up by pmd. -hiv serology negative  5-Depression: -will continue Zoloft   6-Leukocytosis: -likely from stress or dehydaration -no signs of infection, no fever,c cxr unremarkable -normalized with hydration  5. Cough, cxr unremarkable, , respiratory viral panel negative, mucinex, cough resolved at discharge  6. cigarrette smoker: smoking cessation education provided  Code Status: Full Family Communication: no family at bedside   Disposition Plan: home 5/21, patient states that he is going to live with his friend after discharge, he states his friend does not have drug use problems.   Consultants:  psych  Procedures:  none  Antibiotics:  none   Discharge Exam: BP 109/52 mmHg  Pulse 56  Temp(Src) 97.9 F (36.6 C) (Oral)  Resp 16  Ht 6\' 1"  (1.854 m)  Wt 74.39 kg (164 lb)  BMI  21.64 kg/m2  SpO2 97%   General: NAD  Cardiovascular: RRR  Respiratory: CTABL  Abdomen: Soft/ND/NT, positive BS  Musculoskeletal: No Edema  Neuro: aaox3   Discharge Instructions You were cared for by a hospitalist during your hospital stay. If you have any questions about  your discharge medications or the care you received while you were in the hospital after you are discharged, you can call the unit and asked to speak with the hospitalist on call if the hospitalist that took care of you is not available. Once you are discharged, your primary care physician will handle any further medical issues. Please note that NO REFILLS for any discharge medications will be authorized once you are discharged, as it is imperative that you return to your primary care physician (or establish a relationship with a primary care physician if you do not have one) for your aftercare needs so that they can reassess your need for medications and monitor your lab values.      Discharge Instructions    Discharge instructions    Complete by:  As directed   Regular diet,     Increase activity slowly    Complete by:  As directed             Medication List    TAKE these medications        sertraline 50 MG tablet  Commonly known as:  ZOLOFT  Take 50 mg by mouth daily.       Allergies  Allergen Reactions  . Vicodin [Hydrocodone-Acetaminophen] Itching   Follow-up Information    Follow up with Please use the resources provided to you in emergency room by case manager to assist you're your choice of doctor for follow up .   Why:  A referral for you has been sent to [facility] if you have not received a call in 3 days you may contact them Call Scherry Ran at [phone] Tuesday-Friday [email or web address]   Contact information:   These [place] uninsured resources provide possible primary care providers, resources for discounted medications, housing, dental resources, affordable care act information, plus other resources for [employer]        Follow up with [employer] [facility] .   Why:  packet of information also given to you   Contact information:   www.[facility].org 24 hour help line:  [phone]      Follow up with [facility] In 1 week.   Why:  hospital discharge follow up   Contact information:   [address] [facility] [address] [phone]       The results of significant diagnostics from this hospitalization (including imaging, microbiology, ancillary and laboratory) are listed below for reference.    Significant Diagnostic Studies: Dg Chest Portable 1 View  02/14/2016  CLINICAL DATA:  Heroin overdose with hypoxia EXAM: PORTABLE CHEST 1 VIEW COMPARISON:  April 05, 2014 FINDINGS: Shallow degree of inspiration. There is vessel crowding in the bases. There is no frank edema or consolidation. Heart is upper normal in size with pulmonary vascularity within normal limits. No adenopathy. No bone lesions. No pneumothorax. IMPRESSION: Shallow degree of inspiration. No edema or consolidation. Heart upper normal in size. Electronically Signed   By: Bretta Bang III M.D.   On: 02/14/2016 09:36    Microbiology: Recent Results (from the past 240 hour(s))  MRSA PCR Screening     Status: None   Collection Time: 02/14/16  1:17 PM  Result Value Ref Range Status   MRSA by PCR NEGATIVE NEGATIVE Final    Comment:        The GeneXpert MRSA Assay (FDA approved for NASAL specimens only), is one component of a comprehensive MRSA colonization surveillance program. It is not intended to diagnose MRSA infection nor to guide or monitor treatment for MRSA infections.   Respiratory Panel by PCR     Status: None   Collection Time: 02/15/16 11:24 AM  Result Value Ref Range Status   Adenovirus NOT DETECTED NOT DETECTED Final   Coronavirus 229E NOT DETECTED NOT DETECTED Final   Coronavirus HKU1 NOT DETECTED NOT DETECTED Final   Coronavirus NL63 NOT DETECTED NOT DETECTED Final   Coronavirus OC43 NOT DETECTED NOT DETECTED Final   Metapneumovirus NOT DETECTED NOT DETECTED Final   Rhinovirus / Enterovirus NOT DETECTED NOT DETECTED Final   Influenza A NOT DETECTED NOT  DETECTED Final   Influenza A H1 NOT DETECTED NOT DETECTED Final   Influenza A H1 2009 NOT DETECTED NOT DETECTED Final   Influenza A H3 NOT DETECTED NOT DETECTED Final   Influenza B NOT DETECTED NOT DETECTED Final   Parainfluenza Virus 1 NOT DETECTED NOT DETECTED Final   Parainfluenza Virus 2 NOT DETECTED NOT DETECTED Final   Parainfluenza Virus 3 NOT DETECTED NOT DETECTED Final   Parainfluenza Virus 4 NOT DETECTED NOT DETECTED Final   Respiratory Syncytial Virus NOT DETECTED NOT DETECTED Final   Bordetella pertussis NOT DETECTED NOT DETECTED Final   Chlamydophila pneumoniae NOT DETECTED NOT DETECTED Final   Mycoplasma pneumoniae NOT DETECTED NOT DETECTED Final  MRSA PCR Screening     Status: None   Collection Time: 02/15/16 11:25 AM  Result Value Ref Range Status   MRSA by PCR NEGATIVE NEGATIVE Final    Comment:        The GeneXpert MRSA Assay (FDA approved for NASAL specimens only), is one component of a comprehensive MRSA colonization surveillance program. It is not intended to diagnose MRSA infection nor to guide or monitor treatment for MRSA infections.      Labs: Basic Metabolic Panel:  Recent Labs Lab 02/14/16 0922 02/14/16 1520 02/15/16 0317 02/16/16 0308 02/17/16 0533  NA 140  --  139 139 140  K 3.1*  --  3.4* 3.3* 3.6  CL 108  --  108 109 108  CO2 26  --  27 24 27   GLUCOSE 129*  --  97 91 97  BUN 9  --  5* 7 8  CREATININE 0.86  --  0.78 0.70 0.82  CALCIUM 8.0*  --  7.8* 8.1* 8.4*  MG  --  1.8  --  1.8 1.9  PHOS  --  2.5  --   --   --    Liver Function Tests:  Recent Labs Lab 02/14/16 0922 02/15/16 0317  AST 42* 24  ALT 37 25  ALKPHOS 74 57  BILITOT 0.6 0.4  PROT 6.8 5.4*  ALBUMIN 3.8 3.0*   No results for input(s): LIPASE, AMYLASE in the last 168 hours. No results for input(s): AMMONIA in the last 168 hours. CBC:  Recent Labs Lab 02/14/16 0922 02/15/16 0317  WBC 17.6* 10.0  NEUTROABS 14.9*  --   HGB 13.1 11.0*  HCT 38.8* 32.6*  MCV  92.8 90.8  PLT 255 232   Cardiac Enzymes: No results for input(s): CKTOTAL, CKMB, CKMBINDEX, TROPONINI in the last 168 hours. BNP: BNP (last 3 results) No results for input(s):  BNP in the last 8760 hours.  ProBNP (last 3 results) No results for input(s): PROBNP in the last 8760 hours.  CBG: No results for input(s): GLUCAP in the last 168 hours.     SignedAlbertine Grates MD, PhD  [facility] 02/17/2016, 9:51 AM

## 2016-04-29 ENCOUNTER — Emergency Department (HOSPITAL_COMMUNITY)
Admission: EM | Admit: 2016-04-29 | Discharge: 2016-04-30 | Disposition: A | Discharge status: Home / Self Care | Payer: Self-pay | Attending: Emergency Medicine | Admitting: Emergency Medicine

## 2016-04-29 DIAGNOSIS — T401X1A Poisoning by heroin, accidental (unintentional), initial encounter: Secondary | ICD-10-CM | POA: Insufficient documentation

## 2016-04-29 DIAGNOSIS — F17210 Nicotine dependence, cigarettes, uncomplicated: Secondary | ICD-10-CM | POA: Insufficient documentation

## 2016-04-29 DIAGNOSIS — Z79899 Other long term (current) drug therapy: Secondary | ICD-10-CM | POA: Insufficient documentation

## 2016-04-29 NOTE — ED Triage Notes (Signed)
EMS administered Narcan 0.5 mg

## 2016-04-29 NOTE — ED Triage Notes (Signed)
Patient arrives by EMS-called out in reference to male  Found outside of [place]-used heroin 2 hours ago.  On EMS arrival, found patient unresponsive with sat 89%. Patient is now alert wit stable sat on O2-#16 left ACF established.

## 2016-04-29 NOTE — ED Provider Notes (Signed)
[facility]-EMERGENCY DEPT Provider Note   CSN: [id number] Arrival date & time: 04/29/16  2344  First Provider Contact:   First MD Initiated Contact with Patient 04/29/16 2359     By signing my name below, I, Emmanuella Mensah, attest that this documentation has been prepared under the direction and in the presence of Edith Groleau, MD. Electronically Signed: Angelene Giovanni, ED Scribe. 04/29/16. 12:07 AM.    History   Chief Complaint Chief Complaint  Patient presents with  . Heroin Overdose   HPI Comments: JULIANN KEELEY is a 45 y.o. male brought in by ambulance, who presents to the Emergency Department for evaluation s/p heroin overdose. Pt explains that he used an unknown amount of heroin approx. two hours ago and is unsure of how long he was out until someone called 911 after finding him outside of [place]. He denies any other drug use or ETOH use. Pt received 0.5 mg of Narcan by EMS en route. He denies any SI. No fever, chills, SOB, chest pain, or n/v/d.    The history is provided by the patient. No language interpreter was used.    Past Medical History:  Diagnosis Date  . Hep C w/o coma, chronic (HCC)   . Heroin abuse   . MRSA (methicillin resistant staph aureus) culture positive   . RLS (restless legs syndrome)     Patient Active Problem List   Diagnosis Date Noted  . Drug overdose 02/14/2016  . Heroin abuse 02/14/2016  . Chronic hepatitis C (HCC) 02/14/2016  . Depression 02/14/2016  . Heroin overdose 02/14/2016  . Heroin dependence (HCC) 05/08/2014  . Abscess of buttock, left 03/13/2014  . Fever 03/12/2014  . Abscess, gluteal, left 03/12/2014  . Sepsis affecting skin 03/12/2014  . Hypokalemia 03/12/2014  . Cellulitis of right hand 03/12/2014  . Cellulitis of right little finger 03/12/2014    Past Surgical History:  Procedure Laterality Date  . APPENDECTOMY    . HERNIA REPAIR         Home Medications    Prior to Admission medications   Medication Sig  Start Date End Date Taking? Authorizing Provider  sertraline (ZOLOFT) 50 MG tablet Take 50 mg by mouth daily.    Historical Provider, MD    Family History Family History  Problem Relation Age of Onset  . Lymphoma Father     Social History Social History  Substance Use Topics  . Smoking status: Current Every Day Smoker    Packs/day: 1.00    Types: Cigarettes  . Smokeless tobacco: Not on file  . Alcohol use Yes     Comment: occasionally     Allergies   Vicodin [hydrocodone-acetaminophen]   Review of Systems Review of Systems  Constitutional: Negative for chills and fever.  HENT: Negative for congestion.   Respiratory: Negative for shortness of breath.   Cardiovascular: Negative for chest pain.  Gastrointestinal: Negative for diarrhea, nausea and vomiting.  Genitourinary: Negative for enuresis.  Psychiatric/Behavioral: Negative for agitation, behavioral problems, confusion, decreased concentration, dysphoric mood, hallucinations, self-injury, sleep disturbance and suicidal ideas. The patient is not nervous/anxious and is not hyperactive.   All other systems reviewed and are negative.    Physical Exam Updated Vital Signs BP 133/83 (BP Location: Left Arm)   Pulse 80   Temp 98.2 F (36.8 C)   Resp 17   Ht 6\' 1"  (1.854 m)   Wt 150 lb (68 kg)   SpO2 95%   BMI 19.79 kg/m   Physical Exam  Constitutional: He is oriented to person, place, and time. He appears well-developed and well-nourished. No distress.  HENT:  Head: Normocephalic and atraumatic.  Mouth/Throat: Oropharynx is clear and moist.  Tracheas midline  Eyes: Conjunctivae and EOM are normal.  Pin point pupil  Neck: Neck supple. No tracheal deviation present.  No bruits  Cardiovascular: Normal rate, regular rhythm and normal heart sounds.   Pulmonary/Chest: Effort normal. No stridor. No respiratory distress.  Lungs clear  Abdominal: Soft. He exhibits no mass. There is no rebound and no guarding.    Musculoskeletal: Normal range of motion.  Neurological: He is alert and oriented to person, place, and time. No cranial nerve deficit.  Skin: Skin is warm and dry. Capillary refill takes less than 2 seconds.  Psychiatric: He has a normal mood and affect. His behavior is normal.  Nursing note and vitals reviewed.    ED Treatments / Results  DIAGNOSTIC STUDIES: Oxygen Saturation is 95% on RA, normal by my interpretation.    COORDINATION OF CARE: 12:01 AM- Pt advised of plan for treatment and pt agrees. Pt will receive IV fluids here in the ED.   Labs (all labs ordered are listed, but only abnormal results are displayed) Labs Reviewed - No data to display  EKG  EKG Interpretation None       Radiology No results found.  Procedures Procedures (including critical care time)  Medications Ordered in ED Medications - No data to display   Initial Impression / Assessment and Plan / ED Course  Chynah Orihuela, MD has reviewed the triage vital signs and the nursing notes.  Pertinent labs & imaging results that were available during my care of the patient were reviewed by me and considered in my medical decision making (see chart for details).  Clinical Course    PO challenged successfully ambulating in the department without problem.  No SI or HI.  Stable for discharge at this tome  Final Clinical Impressions(s) / ED Diagnoses   Final diagnoses:  None   I personally performed the services described in this documentation, which was scribed in my presence. The recorded information has been reviewed and is accurate.   All questions answered to patient's satisfaction. Based on history and exam patient has been appropriately medically screened and emergency conditions excluded. Patient is stable for discharge at this time. Follow up with your PMDfor recheck in 2 daysand strict return precautions given.  New Prescriptions New Prescriptions   No medications on file     Kayia Billinger, MD 04/30/16 0221

## 2016-04-29 NOTE — ED Notes (Signed)
Bed: WA21 Expected date:  Expected time:  Means of arrival:  Comments: EMS  

## 2016-04-30 ENCOUNTER — Encounter (HOSPITAL_COMMUNITY): Payer: Self-pay | Admitting: Emergency Medicine

## 2016-04-30 MED ORDER — SODIUM CHLORIDE 0.9 % IV BOLUS (SEPSIS)
1000.0000 mL | Freq: Once | INTRAVENOUS | Status: AC
  Administered 2016-04-30: 1000 mL via INTRAVENOUS

## 2016-04-30 MED ORDER — NALOXONE HCL 0.4 MG/ML IJ SOLN
0.4000 mg | Freq: Once | INTRAMUSCULAR | Status: AC
  Administered 2016-04-30: 0.4 mg via INTRAVENOUS
  Filled 2016-04-30: qty 1

## 2016-04-30 NOTE — ED Notes (Signed)
Pt ambulated approximately 500 feet with no complaints of dizziness or shortness of breath.  Pt given sandwich.  Pt denies nausea.

## 2016-04-30 NOTE — ED Notes (Signed)
Pt given water 

## 2016-04-30 NOTE — ED Notes (Signed)
Pt ambulatory and independent at discharge.  Verbalized understanding of discharge instructions 

## 2018-10-16 ENCOUNTER — Emergency Department (HOSPITAL_COMMUNITY)
Admission: EM | Admit: 2018-10-16 | Discharge: 2018-10-16 | Disposition: A | Discharge status: Home / Self Care | Payer: Self-pay | Attending: Emergency Medicine | Admitting: Emergency Medicine

## 2018-10-16 ENCOUNTER — Encounter (HOSPITAL_COMMUNITY): Payer: Self-pay

## 2018-10-16 DIAGNOSIS — M545 Low back pain, unspecified: Secondary | ICD-10-CM

## 2018-10-16 DIAGNOSIS — F17210 Nicotine dependence, cigarettes, uncomplicated: Secondary | ICD-10-CM | POA: Insufficient documentation

## 2018-10-16 DIAGNOSIS — Z79899 Other long term (current) drug therapy: Secondary | ICD-10-CM | POA: Insufficient documentation

## 2018-10-16 DIAGNOSIS — M62830 Muscle spasm of back: Secondary | ICD-10-CM | POA: Insufficient documentation

## 2018-10-16 NOTE — ED Notes (Signed)
Bed: WTR5 Expected date:  Expected time:  Means of arrival:  Comments: 

## 2018-10-16 NOTE — Discharge Instructions (Signed)
Back Pain: Your back pain should be treated with medicines such as ibuprofen or aleve and this back pain should get better over the next 2 weeks.  However if you develop severe or worsening pain, low back pain with fever, numbness, weakness or inability to walk or urinate, you should return to the ER immediately.  Please follow up with your doctor this week for a recheck if still having symptoms.  Avoid heavy lifting over 10 pounds over the next two weeks.  Low back pain is discomfort in the lower back that may be due to injuries to muscles and ligaments around the spine.  Occasionally, it may be caused by a a problem to a part of the spine called a disc.  The pain may last several days or a week;  However, most patients get completely well in 4 weeks.  Self - care:  The application of heat can help soothe the pain.  Maintaining your daily activities, including walking, is encourged, as it will help you get better faster than just staying in bed. Perform gentle stretching as discussed. Drink plenty of fluids.  Medications are also useful to help with pain control.  A commonly prescribed medication includes over the counter Tylenol; take as directed on the bottle.   Non steroidal anti inflammatory medications including Ibuprofen and naproxen;  These medications help both pain and swelling and are very useful in treating back pain.  They should be taken with food, as they can cause stomach upset, and more seriously, stomach bleeding.     SEEK IMMEDIATE MEDICAL ATTENTION IF: New numbness, tingling, weakness, or problem with the use of your arms or legs.  Severe back pain not relieved with medications.  Difficulty with or loss of control of your bowel or bladder control.  Increasing pain in any areas of the body (such as chest or abdominal pain).  Shortness of breath, dizziness or fainting.  Nausea (feeling sick to your stomach), vomiting, fever, or sweats.  You will need to follow up with  Your  primary healthcare provider in 1-2 weeks for reassessment.

## 2018-10-16 NOTE — ED Triage Notes (Addendum)
Patient arrived via [employer]ust[employer] from [employer].   Patient c/o lower back pain/tail bone from fall 3 days prior.  Patient has active probation violation and was in the process of going to jail and started c/o of back pain.    Ambulatory in triage.

## 2018-10-16 NOTE — ED Provider Notes (Signed)
[facility]-EMERGENCY DEPT Provider Note   CSN: [id number] Arrival date & time: 10/16/18  1659     History   Chief Complaint Chief Complaint  Patient presents with  . Back Pain    HPI Joseph Spence is a 48 y.o. male with a PMHx of Hep C, heroin abuse, and restless leg syndrome, who presents to the ED under police custody with complaints of right lower back pain that began 4 days ago.  Patient states that he slid down a muddy hill and landed on his tailbone about 4 days ago.  Since then he complains of 9/10 constant sharp nonradiating right lower back pain that worsens with walking and no treatments tried or known alleviating factors.  He denies hitting his head or losing consciousness.  He also denies any fevers, chills, chest pain, shortness breath, abdominal pain, n/v/d/c, dysuria, hematuria, incontinence of urine or stool, saddle anesthesia or cauda equina symptoms, numbness, tingling, focal weakness, or any other complaints at this time.  The history is provided by the patient and medical records. No language interpreter was used.  Back Pain  Associated symptoms: no abdominal pain, no chest pain, no dysuria, no fever, no numbness and no weakness     Past Medical History:  Diagnosis Date  . Hep C w/o coma, chronic (HCC)   . Heroin abuse (HCC)   . MRSA (methicillin resistant staph aureus) culture positive   . RLS (restless legs syndrome)     Patient Active Problem List   Diagnosis Date Noted  . Drug overdose 02/14/2016  . Heroin abuse (HCC) 02/14/2016  . Chronic hepatitis C (HCC) 02/14/2016  . Depression 02/14/2016  . Heroin overdose (HCC) 02/14/2016  . Heroin dependence (HCC) 05/08/2014  . Abscess of buttock, left 03/13/2014  . Fever 03/12/2014  . Abscess, gluteal, left 03/12/2014  . Sepsis affecting skin 03/12/2014  . Hypokalemia 03/12/2014  . Cellulitis of right hand 03/12/2014  . Cellulitis of right little finger 03/12/2014    Past  Surgical History:  Procedure Laterality Date  . APPENDECTOMY    . HERNIA REPAIR          Home Medications    Prior to Admission medications   Medication Sig Start Date End Date Taking? Authorizing Provider  sertraline (ZOLOFT) 50 MG tablet Take 50 mg by mouth daily.    [provider]    Family History Family History  Problem Relation Age of Onset  . Lymphoma Father     Social History Social History   Tobacco Use  . Smoking status: Current Every Day Smoker    Packs/day: 1.00    Types: Cigarettes  . Smokeless tobacco: Never Used  Substance Use Topics  . Alcohol use: Yes    Comment: occasionally  . Drug use: Yes    Comment: heroin     Allergies   Vicodin [hydrocodone-acetaminophen]   Review of Systems Review of Systems  Constitutional: Negative for chills and fever.  HENT: Negative for facial swelling (no head inj).   Respiratory: Negative for shortness of breath.   Cardiovascular: Negative for chest pain.  Gastrointestinal: Negative for abdominal pain, constipation, diarrhea, nausea and vomiting.  Genitourinary: Negative for difficulty urinating (no incontinence), dysuria and hematuria.  Musculoskeletal: Positive for back pain. Negative for arthralgias and myalgias.  Skin: Negative for color change.  Allergic/Immunologic: Negative for immunocompromised state.  Neurological: Negative for syncope, weakness and numbness.  Psychiatric/Behavioral: Negative for confusion.   All other systems reviewed and are negative for  acute change except as noted in the HPI.    Physical Exam Updated Vital Signs BP 138/89   Pulse 63   Temp 98.4 F (36.9 C)   Resp 17   SpO2 97%   Physical Exam Vitals signs and nursing note reviewed.  Constitutional:      General: He is not in acute distress.    Appearance: Normal appearance. He is well-developed. He is not toxic-appearing.     Comments: Afebrile, nontoxic, NAD  HENT:     Head: Normocephalic and atraumatic.    Eyes:     General:        Right eye: No discharge.        Left eye: No discharge.     Conjunctiva/sclera: Conjunctivae normal.  Neck:     Musculoskeletal: Normal range of motion and neck supple.  Cardiovascular:     Rate and Rhythm: Normal rate.     Pulses: Normal pulses.  Pulmonary:     Effort: Pulmonary effort is normal. No respiratory distress.  Abdominal:     General: There is no distension.  Musculoskeletal: Normal range of motion.     Right hip: Normal.     Left hip: Normal.     Lumbar back: He exhibits tenderness and spasm. He exhibits normal range of motion, no bony tenderness and no deformity.       Back:     Comments: Lumbar spine with FROM intact without spinous process TTP, no bony stepoffs or deformities, with mild R sided paraspinous muscle TTP and palpable muscle spasms. Negative SLR bilaterally. No hip or tailbone bony tenderness. No overlying skin changes. Strength and sensation grossly intact in all extremities, gait steady and nonantalgic. Distal pulses intact.   Skin:    General: Skin is warm and dry.     Findings: No rash.  Neurological:     Mental Status: He is alert and oriented to person, place, and time.     Sensory: Sensation is intact. No sensory deficit.     Motor: Motor function is intact.  Psychiatric:        Mood and Affect: Mood and affect normal.        Behavior: Behavior normal.      ED Treatments / Results  Labs (all labs ordered are listed, but only abnormal results are displayed) Labs Reviewed - No data to display  EKG None  Radiology No results found.  Procedures Procedures (including critical care time)  Medications Ordered in ED Medications - No data to display   Initial Impression / Assessment and Plan / ED Course  I have reviewed the triage vital signs and the nursing notes.  Pertinent labs & imaging results that were available during my care of the patient were reviewed by me and considered in my medical decision  making (see chart for details).     48 y.o. male here with R sided lower back pain after he slid down a hill and landed on his buttocks. On exam, mild R paralumbar muscle TTP; no midline spinal tenderness. No red flag s/s of low back pain. No s/s of central cord compression or cauda equina. Lower extremities are neurovascularly intact and patient is ambulating without difficulty. No hip tenderness, no midline tailbone tenderness.  No urinary complaints. Doubt need for imaging/labs, likely muscular strain vs contusion.  Patient was counseled on back pain precautions and told to do activity as tolerated but do not lift, push, or pull heavy objects more than 10 pounds for  the next week. Patient counseled to use ice or heat on back for no longer than 20 minutes every hour. Advised tylenol/NSAIDs.    Patient urged to follow-up with PCP in 1-2wks for recheck of symptoms, or sooner if pain does not improve with treatment and rest or if pain becomes recurrent. Urged to return with worsening severe pain, loss of bowel or bladder control, trouble walking, or other worsening of symptoms; explicit return precautions given. The patient verbalizes understanding and agrees with the plan, I have answered their questions. Discharge instructions concerning home care and prescriptions have been given. The patient is STABLE and is discharged to home in good condition.     Final Clinical Impressions(s) / ED Diagnoses   Final diagnoses:  Acute right-sided low back pain without sciatica  Muscle spasm of back    ED Discharge Orders    [address]       Kasi Lasky, [place] CityMercedes, [place] JerseyPA-C 10/16/18 1717    Arby BarrettePfeiffer, Marcy, MD 10/17/18 0001

## 2018-12-29 ENCOUNTER — Encounter (HOSPITAL_COMMUNITY): Payer: Self-pay

## 2018-12-29 ENCOUNTER — Emergency Department (HOSPITAL_COMMUNITY)
Admission: EM | Admit: 2018-12-29 | Discharge: 2018-12-29 | Disposition: A | Discharge status: Home / Self Care | Payer: Self-pay | Attending: Emergency Medicine | Admitting: Emergency Medicine

## 2018-12-29 DIAGNOSIS — T401X1A Poisoning by heroin, accidental (unintentional), initial encounter: Secondary | ICD-10-CM | POA: Insufficient documentation

## 2018-12-29 DIAGNOSIS — F17210 Nicotine dependence, cigarettes, uncomplicated: Secondary | ICD-10-CM | POA: Insufficient documentation

## 2018-12-29 LAB — CBC
HCT: 38.5 % — ABNORMAL LOW (ref 39.0–52.0)
Hemoglobin: 12.7 g/dL — ABNORMAL LOW (ref 13.0–17.0)
MCH: 31.4 pg (ref 26.0–34.0)
MCHC: 33 g/dL (ref 30.0–36.0)
MCV: 95.1 fL (ref 80.0–100.0)
Platelets: 250 10*3/uL (ref 150–400)
RBC: 4.05 MIL/uL — ABNORMAL LOW (ref 4.22–5.81)
RDW: 14.6 % (ref 11.5–15.5)
WBC: 8.1 10*3/uL (ref 4.0–10.5)
nRBC: 0 % (ref 0.0–0.2)

## 2018-12-29 LAB — COMPREHENSIVE METABOLIC PANEL
ALT: 67 U/L — ABNORMAL HIGH (ref 0–44)
AST: 52 U/L — ABNORMAL HIGH (ref 15–41)
Albumin: 3.8 g/dL (ref 3.5–5.0)
Alkaline Phosphatase: 62 U/L (ref 38–126)
Anion gap: 7 (ref 5–15)
BUN: 12 mg/dL (ref 6–20)
CO2: 21 mmol/L — ABNORMAL LOW (ref 22–32)
Calcium: 7.9 mg/dL — ABNORMAL LOW (ref 8.9–10.3)
Chloride: 110 mmol/L (ref 98–111)
Creatinine, Ser: 0.64 mg/dL (ref 0.61–1.24)
GFR calc Af Amer: >60 mL/min (ref >60)
GFR calc non Af Amer: >60 mL/min (ref >60)
Glucose, Bld: 141 mg/dL — ABNORMAL HIGH (ref 70–99)
Potassium: 3.5 mmol/L (ref 3.5–5.1)
Sodium: 138 mmol/L (ref 135–145)
Total Bilirubin: 0.4 mg/dL (ref 0.3–1.2)
Total Protein: 7.2 g/dL (ref 6.5–8.1)

## 2018-12-29 LAB — ETHANOL: Alcohol, Ethyl (B): <10 mg/dL (ref <10)

## 2018-12-29 MED ORDER — SODIUM CHLORIDE 0.9 % IV BOLUS
1000.0000 mL | Freq: Once | INTRAVENOUS | Status: AC
  Administered 2018-12-29: 1000 mL via INTRAVENOUS

## 2018-12-29 MED ORDER — NALOXONE HCL 4 MG/0.1ML NA LIQD
1.0000 | Freq: Once | NASAL | Status: AC | PRN
  Administered 2018-12-29: 18:00:00 1 via NASAL
  Filled 2018-12-29: qty 4

## 2018-12-29 NOTE — ED Notes (Signed)
Johana, PA at bedside.  

## 2018-12-29 NOTE — Discharge Instructions (Signed)
You were provided with a packet of narcan nasal spray. Please use this is needed for rescue, this is not a medication for routine use. Please follow up with primary care as needed, referral provided to establish care.

## 2018-12-29 NOTE — ED Provider Notes (Signed)
[facility]-EMERGENCY DEPT Provider Note   CSN: [id number] Arrival date & time: 12/29/18  1258    History   Chief Complaint Chief Complaint  Patient presents with  . Drug Overdose    HPI Joseph Spence is a 48 y.o. male.     48 y/o male with a PMH of Hep C, MRSA, Heroin abuse presents to the ED via EMS for overdose. Patient reports he was talking to "the wrong people" when he was offered heroin, he reports injecting this to the left [facility]. He reports loss of consciousness as he states waking up to the EMS staff. Patient was given 0.5 of Narcan on site. He states having a problem with heroin abuse, last time he used was 60 days ago.  He denies any chest pain, shortness of breath, headache, other complaints.     Past Medical History:  Diagnosis Date  . Hep C w/o coma, chronic (HCC)   . Heroin abuse (HCC)   . MRSA (methicillin resistant staph aureus) culture positive   . RLS (restless legs syndrome)     Patient Active Problem List   Diagnosis Date Noted  . Drug overdose 02/14/2016  . Heroin abuse (HCC) 02/14/2016  . Chronic hepatitis C (HCC) 02/14/2016  . Depression 02/14/2016  . Heroin overdose (HCC) 02/14/2016  . Heroin dependence (HCC) 05/08/2014  . Abscess of buttock, left 03/13/2014  . Fever 03/12/2014  . Abscess, gluteal, left 03/12/2014  . Sepsis affecting skin 03/12/2014  . Hypokalemia 03/12/2014  . Cellulitis of right hand 03/12/2014  . Cellulitis of right little finger 03/12/2014    Past Surgical History:  Procedure Laterality Date  . APPENDECTOMY    . HERNIA REPAIR          Home Medications    Prior to Admission medications   Not on File    Family History Family History  Problem Relation Age of Onset  . Lymphoma Father     Social History Social History   Tobacco Use  . Smoking status: Current Every Day Smoker    Packs/day: 1.00    Types: Cigarettes  . Smokeless tobacco: Never Used  Substance Use Topics  .  Alcohol use: Yes    Comment: occasionally  . Drug use: Yes    Comment: heroin     Allergies   Vicodin [hydrocodone-acetaminophen]   Review of Systems Review of Systems  Constitutional: Negative for chills and fever.  HENT: Negative for ear pain and sore throat.   Eyes: Negative for pain and visual disturbance.  Respiratory: Negative for cough and shortness of breath.   Cardiovascular: Negative for chest pain and palpitations.  Gastrointestinal: Negative for abdominal pain and vomiting.  Genitourinary: Negative for dysuria and hematuria.  Musculoskeletal: Negative for arthralgias and back pain.  Skin: Negative for color change and rash.  Neurological: Negative for seizures and syncope.  All other systems reviewed and are negative.    Physical Exam Updated Vital Signs BP 119/81   Pulse 75   Temp 98 F (36.7 C) (Oral)   Resp 18   SpO2 97%   Physical Exam Vitals signs and nursing note reviewed.  Constitutional:      Appearance: Normal appearance. He is well-developed.     Comments: Non ill appearing.   HENT:     Head: Normocephalic and atraumatic.  Eyes:     General: No scleral icterus.    Pupils: Pupils are equal, round, and reactive to light.     Comments: Pinpoint  pupils.   Neck:     Musculoskeletal: Normal range of motion.  Cardiovascular:     Heart sounds: Normal heart sounds.  Pulmonary:     Effort: Pulmonary effort is normal.     Breath sounds: Normal breath sounds. No wheezing.  Chest:     Chest wall: No tenderness.  Abdominal:     General: Bowel sounds are normal. There is no distension.     Palpations: Abdomen is soft.     Tenderness: There is no abdominal tenderness.  Musculoskeletal:        General: No tenderness or deformity.  Skin:    General: Skin is warm and dry.  Neurological:     Mental Status: He is alert and oriented to person, place, and time.      ED Treatments / Results  Labs (all labs ordered are listed, but only abnormal  results are displayed) Labs Reviewed  COMPREHENSIVE METABOLIC PANEL - Abnormal; Notable for the following components:      Result Value   CO2 21 (*)    Glucose, Bld 141 (*)    Calcium 7.9 (*)    AST 52 (*)    ALT 67 (*)    All other components within normal limits  CBC - Abnormal; Notable for the following components:   RBC 4.05 (*)    Hemoglobin 12.7 (*)    HCT 38.5 (*)    All other components within normal limits  ETHANOL  RAPID URINE DRUG SCREEN, HOSP PERFORMED    EKG None  Radiology No results found.  Procedures Procedures (including critical care time)  Medications Ordered in ED Medications  naloxone (NARCAN) nasal spray 4 mg/0.1 mL (has no administration in time range)  sodium chloride 0.9 % bolus 1,000 mL (0 mLs Intravenous Stopped 12/29/18 1459)     Initial Impression / Assessment and Plan / ED Course  I have reviewed the triage vital signs and the nursing notes.  Pertinent labs & imaging results that were available during my care of the patient were reviewed by me and considered in my medical decision making (see chart for details).    Patient with a previous history of heroin abuse presents to the ED after unwitnessed overdose.  He received .05 Narcan by EMS. Patient states he remembers injecting heroin while hanging out with the "wrong crew ", reports waking up with EMS.  Denies any chest pain, shortness of breath, other complaints at this time.  CMP showed no electrolyte abnormality, LFt's are slightly elevated compared to his previous results two years ago. Ethanol is <10. Patient received a liter bolus. Reports no complaints at this time.  I have discussed this patient with Dr. Rubin Payor, he agrees with management at this time.   5:40 PM Patient attempted to urinate several times, no success.  At this time patient is awake alert and oriented.  Will discharge patient, stable vital signs.  Return precautions provided at length.  Final Clinical Impressions(s) /  ED Diagnoses   Final diagnoses:  Accidental overdose of heroin, initial encounter [facility])    ED Discharge Orders    None       Claude Manges, PA-C 12/29/18 1740    Benjiman Core, MD 12/30/18 1059

## 2018-12-29 NOTE — Patient Outreach (Addendum)
ED Peer Support Specialist Patient Intake (Complete at intake & 30-60 Day Follow-up)  Name: Joseph Spence  MRN: [id number]  Age: 48 y.o.   Date of Admission: 12/29/2018  Intake: Initial Comments:      Primary Reason Admitted: heroin overdose  Lab values: Alcohol/ETOH: Negative Positive UDS? Drug screen not completed Amphetamines: Drug screen not completed Barbiturates: Drug screen not completed Benzodiazepines: Drug screen not completed Cocaine: Drug screen not completed Opiates: Drug screen not completed Cannabinoids: Drug screen not completed  Demographic information: Gender: Male Ethnicity: White Marital Status: Widowed Insurance Status: Uninsured/Self-pay Ecologist (Work Neurosurgeon, Physicist, medical, etc.: No Lives with: Alone Living situation: Homeless  Reported Patient History: Patient reported health conditions: None Patient aware of HIV and hepatitis status: Yes (comment)(Hep C)  In past year, has patient visited ED for any reason? Yes  Number of ED visits: 1  Reason(s) for visit: lower back pain, tail bone pain   In past year, has patient been hospitalized for any reason? No  Number of hospitalizations:    Reason(s) for hospitalization:    In past year, has patient been arrested? No  Number of arrests:    Reason(s) for arrest:    In past year, has patient been incarcerated? No  Number of incarcerations:    Reason(s) for incarceration:    In past year, has patient received medication-assisted treatment? No  In past year, patient received the following treatments: None  In past year, has patient received any harm reduction services? Yes  Did this include any of the following? Making contact with an SEP([facility])  In past year, has patient received care from a mental health provider for diagnosis other than SUD? No  In past year, is this first time patient has overdosed? No  Number of past overdoses: 2  In past year,  is this first time patient has been hospitalized for an overdose? Yes  Number of hospitalizations for overdose(s):    Is patient currently receiving treatment for a mental health diagnosis? No  Patient reports experiencing difficulty participating in SUD treatment: No    Most important reason(s) for this difficulty?    Has patient received prior services for treatment? No  In past, patient has received services from following agencies:    Plan of Care:  Suggested follow up at these agencies/treatment centers: Other (comment)(Patient did not want any referrals or any information for recovery resources from [facility] at this time. [facility] still provided [facility] contact information. )  Other information: [facility] met with the patient to provide substance use recovery support and help with substance use treatment resources. [facility] offered to do some referrals/provide follow up information for substance use recovery resources, but the patient did want not any referrals/follow up information. [facility] strongly encouraged the patient to follow up with [facility] for harm reduction services since the patient has been connected with them in the past. [facility] also informed the patient to not hesitate in contacting [facility] for further help with getting connected to substance use recovery resources after future discharge from [place], [place]  12/29/2018 4:01 PM

## 2018-12-29 NOTE — ED Notes (Addendum)
Per [place], [place]. Continue to monitor patient. No other orders at this time. Patient on cardiac monitor and 02 sensor. Given Nasal Narcan for patient to take home in PRN order per PA.

## 2018-12-29 NOTE — ED Notes (Signed)
Patient aware we need urine sample. Urinal at bedside.  

## 2018-12-29 NOTE — ED Notes (Signed)
Patient given soda and sandwich. 

## 2018-12-29 NOTE — ED Notes (Signed)
Peer support at bedside 

## 2018-12-29 NOTE — ED Triage Notes (Signed)
Patient arrived [facility] from behind a restaurant by dumpster using heroin. Patient had paraphernalia on him.  Clean for 70 days per patient.   Patient bagged by EMS  EMS interventions: 0.5 mg narcan  148/110 FR-74 NSR EKG unremarkable  CBG-177 Denies ETHO  20g in left forearm.    Patient is starting to feel nauseas.

## 2018-12-29 NOTE — ED Notes (Signed)
Patient made aware we need urine sample. Patient is attempting to try now. Patient given coffee.

## 2019-01-10 ENCOUNTER — Emergency Department (HOSPITAL_COMMUNITY): Payer: Self-pay

## 2019-01-10 ENCOUNTER — Emergency Department (HOSPITAL_COMMUNITY)
Admission: EM | Admit: 2019-01-10 | Discharge: 2019-01-10 | Disposition: A | Discharge status: Home / Self Care | Payer: Self-pay | Attending: Emergency Medicine | Admitting: Emergency Medicine

## 2019-01-10 ENCOUNTER — Encounter (HOSPITAL_COMMUNITY): Payer: Self-pay | Admitting: Family Medicine

## 2019-01-10 DIAGNOSIS — Z5900 Homelessness unspecified: Secondary | ICD-10-CM

## 2019-01-10 DIAGNOSIS — L02419 Cutaneous abscess of limb, unspecified: Secondary | ICD-10-CM

## 2019-01-10 DIAGNOSIS — F17210 Nicotine dependence, cigarettes, uncomplicated: Secondary | ICD-10-CM | POA: Insufficient documentation

## 2019-01-10 DIAGNOSIS — T401X1A Poisoning by heroin, accidental (unintentional), initial encounter: Secondary | ICD-10-CM | POA: Insufficient documentation

## 2019-01-10 DIAGNOSIS — L02413 Cutaneous abscess of right upper limb: Secondary | ICD-10-CM | POA: Insufficient documentation

## 2019-01-10 LAB — COMPREHENSIVE METABOLIC PANEL WITH GFR
ALT: 48 U/L — ABNORMAL HIGH (ref 0–44)
AST: 43 U/L — ABNORMAL HIGH (ref 15–41)
Albumin: 3.9 g/dL (ref 3.5–5.0)
Alkaline Phosphatase: 78 U/L (ref 38–126)
Anion gap: 11 (ref 5–15)
BUN: 10 mg/dL (ref 6–20)
CO2: 27 mmol/L (ref 22–32)
Calcium: 9 mg/dL (ref 8.9–10.3)
Chloride: 104 mmol/L (ref 98–111)
Creatinine, Ser: 0.69 mg/dL (ref 0.61–1.24)
GFR calc Af Amer: >60 mL/min
GFR calc non Af Amer: >60 mL/min
Glucose, Bld: 119 mg/dL — ABNORMAL HIGH (ref 70–99)
Potassium: 3.6 mmol/L (ref 3.5–5.1)
Sodium: 142 mmol/L (ref 135–145)
Total Bilirubin: 0.3 mg/dL (ref 0.3–1.2)
Total Protein: 8.2 g/dL — ABNORMAL HIGH (ref 6.5–8.1)

## 2019-01-10 LAB — CBC WITH DIFFERENTIAL/PLATELET
Abs Immature Granulocytes: 0.06 K/uL (ref 0.00–0.07)
Basophils Absolute: 0 K/uL (ref 0.0–0.1)
Basophils Relative: 0 %
Eosinophils Absolute: 0 K/uL (ref 0.0–0.5)
Eosinophils Relative: 0 %
HCT: 42.2 % (ref 39.0–52.0)
Hemoglobin: 13.5 g/dL (ref 13.0–17.0)
Immature Granulocytes: 1 %
Lymphocytes Relative: 19 %
Lymphs Abs: 1.8 K/uL (ref 0.7–4.0)
MCH: 31.4 pg (ref 26.0–34.0)
MCHC: 32 g/dL (ref 30.0–36.0)
MCV: 98.1 fL (ref 80.0–100.0)
Monocytes Absolute: 0.7 K/uL (ref 0.1–1.0)
Monocytes Relative: 8 %
Neutro Abs: 7 K/uL (ref 1.7–7.7)
Neutrophils Relative %: 72 %
Platelets: 393 K/uL (ref 150–400)
RBC: 4.3 MIL/uL (ref 4.22–5.81)
RDW: 14.7 % (ref 11.5–15.5)
WBC: 9.7 K/uL (ref 4.0–10.5)
nRBC: 0 % (ref 0.0–0.2)

## 2019-01-10 LAB — ETHANOL: Alcohol, Ethyl (B): <10 mg/dL

## 2019-01-10 MED ORDER — CEPHALEXIN 250 MG PO CAPS
250.0000 mg | ORAL_CAPSULE | Freq: Once | ORAL | Status: AC
  Administered 2019-01-10: 250 mg via ORAL
  Filled 2019-01-10: qty 1

## 2019-01-10 MED ORDER — SODIUM CHLORIDE 0.9 % IV BOLUS (SEPSIS)
1000.0000 mL | Freq: Once | INTRAVENOUS | Status: AC
  Administered 2019-01-10: 1000 mL via INTRAVENOUS

## 2019-01-10 MED ORDER — CEPHALEXIN 500 MG PO CAPS: 500.0000 mg | ORAL_CAPSULE | Freq: Four times a day (QID) | ORAL | 0 refills | Status: AC

## 2019-01-10 MED ORDER — SULFAMETHOXAZOLE-TRIMETHOPRIM 800-160 MG PO TABS: 1.0000 | ORAL_TABLET | Freq: Two times a day (BID) | ORAL | 0 refills | Status: AC

## 2019-01-10 MED ORDER — SULFAMETHOXAZOLE-TRIMETHOPRIM 800-160 MG PO TABS
1.0000 | ORAL_TABLET | Freq: Once | ORAL | Status: AC
  Administered 2019-01-10: 14:00:00 1 via ORAL
  Filled 2019-01-10: qty 1

## 2019-01-10 MED ORDER — LIDOCAINE HCL (PF) 1 % IJ SOLN
5.0000 mL | Freq: Once | INTRAMUSCULAR | Status: AC
  Administered 2019-01-10: 14:00:00 5 mL
  Filled 2019-01-10: qty 30

## 2019-01-10 NOTE — ED Triage Notes (Signed)
Patient was found in [employer] bathroom stall off of [address]. [employer] employees found him and notified EMS. According to EMS, patient was found with needles surrounding him and pin point eyes. Patient is alert to verbal stimuli and Narcan was NOT administered. Patient also has an abscess to the right medical elbow.

## 2019-01-10 NOTE — Discharge Instructions (Addendum)
Peer support may contact you to assist you with your drug addiction and your homelessness. Thank you for allowing me to care for you today. Please return to the emergency department if you have new or worsening symptoms. Take your medications as instructed.

## 2019-01-10 NOTE — ED Provider Notes (Signed)
[facility]-EMERGENCY DEPT Provider Note   CSN: [id number] Arrival date & time: 01/10/19  1336    History   Chief Complaint Chief Complaint  Patient presents with  . Drug Overdose    HPI Joseph Spence is a 48 y.o. male.     Patient is a 48 year old male with past medical history of homelessness, heroin abuse, MRSA, hepatitis C who presents emergency department after heroin overdose.  Patient was found in the [employer] bathroom by EMS and was hypoxic and obtunded.  Narcan was not given.  Patient does not remember being picked up by EMS.  Reports that he last used heroin about an hour ago.  Denies any current symptoms.  He is alert and oriented at this time.     Past Medical History:  Diagnosis Date  . Hep C w/o coma, chronic (HCC)   . Heroin abuse (HCC)   . MRSA (methicillin resistant staph aureus) culture positive   . RLS (restless legs syndrome)     Patient Active Problem List   Diagnosis Date Noted  . Drug overdose 02/14/2016  . Heroin abuse (HCC) 02/14/2016  . Chronic hepatitis C (HCC) 02/14/2016  . Depression 02/14/2016  . Heroin overdose (HCC) 02/14/2016  . Heroin dependence (HCC) 05/08/2014  . Abscess of buttock, left 03/13/2014  . Fever 03/12/2014  . Abscess, gluteal, left 03/12/2014  . Sepsis affecting skin 03/12/2014  . Hypokalemia 03/12/2014  . Cellulitis of right hand 03/12/2014  . Cellulitis of right little finger 03/12/2014    Past Surgical History:  Procedure Laterality Date  . APPENDECTOMY    . HERNIA REPAIR          Home Medications    Prior to Admission medications   Medication Sig Start Date End Date Taking? Authorizing Provider  cephALEXin (KEFLEX) 500 MG capsule Take 1 capsule (500 mg total) by mouth 4 (four) times daily for 5 days. 01/10/19 01/15/19  Arlyn Dunning, PA-C  sulfamethoxazole-trimethoprim (BACTRIM DS,SEPTRA DS) 800-160 MG tablet Take 1 tablet by mouth 2 (two) times daily for 7 days. 01/10/19 01/17/19   Arlyn Dunning, PA-C    Family History Family History  Problem Relation Age of Onset  . Lymphoma Father     Social History Social History   Tobacco Use  . Smoking status: Current Every Day Smoker    Packs/day: 1.00    Types: Cigarettes  . Smokeless tobacco: Never Used  Substance Use Topics  . Alcohol use: Yes    Comment: "Once in a blue moon do I hardly drink:.   . Drug use: Yes    Comment: Clean for 80 some days but replased today     Allergies   Vicodin [hydrocodone-acetaminophen]   Review of Systems Review of Systems  Constitutional: Negative for chills, fatigue and fever.  HENT: Negative for ear pain and sore throat.   Eyes: Negative for pain and visual disturbance.  Respiratory: Negative for cough and shortness of breath.   Cardiovascular: Negative for chest pain and palpitations.  Gastrointestinal: Negative for abdominal pain and vomiting.  Genitourinary: Negative for dysuria and hematuria.  Musculoskeletal: Negative for arthralgias and back pain.  Skin: Negative for color change and rash.  Neurological: Negative for seizures and syncope.  All other systems reviewed and are negative.    Physical Exam Updated Vital Signs BP (!) 155/82   Pulse 63   Temp 98.3 F (36.8 C) (Oral)   Resp 13   Ht 6' (1.829 m)   [employer]  72.6 kg   SpO2 98%   BMI 21.70 kg/m   Physical Exam Vitals signs and nursing note reviewed.  Constitutional:      Appearance: He is well-developed. He is not ill-appearing.     Comments: Patient appears unkempt  HENT:     Head: Normocephalic and atraumatic.     Mouth/Throat:     Mouth: Mucous membranes are moist.  Eyes:     Conjunctiva/sclera: Conjunctivae normal.     Pupils: Pupils are equal, round, and reactive to light.  Neck:     Musculoskeletal: Neck supple.  Cardiovascular:     Rate and Rhythm: Normal rate and regular rhythm.     Heart sounds: No murmur.  Pulmonary:     Effort: Pulmonary effort is normal. No respiratory  distress.     Breath sounds: Normal breath sounds.  Abdominal:     Palpations: Abdomen is soft.     Tenderness: There is no abdominal tenderness.  Skin:    General: Skin is warm and dry.     Comments: 3 cm abscess to the medial right proximal forearm.  There is some purulent drainage.  Fluctuant with cellulitis extending down midway through the forearm  Neurological:     Mental Status: He is alert.     Comments: He is alert and oriented and able to keep a conversation.  When I stop talking to him but he drifts off to sleep.      ED Treatments / Results  Labs (all labs ordered are listed, but only abnormal results are displayed) Labs Reviewed  COMPREHENSIVE METABOLIC PANEL - Abnormal; Notable for the following components:      Result Value   Glucose, Bld 119 (*)    Total Protein 8.2 (*)    AST 43 (*)    ALT 48 (*)    All other components within normal limits  CBC WITH DIFFERENTIAL/PLATELET  ETHANOL    EKG None  Radiology Dg Chest Portable 1 View  Result Date: 01/10/2019 CLINICAL DATA:  48 year old male found down in [employer] bathroom. Probable overdose. EXAM: PORTABLE CHEST 1 VIEW COMPARISON:  Prior chest x-ray 06/15/2017 FINDINGS: The lungs are clear and negative for focal airspace consolidation, pulmonary edema or suspicious pulmonary nodule. No pleural effusion or pneumothorax. Cardiac and mediastinal contours are within normal limits. No acute fracture or lytic or blastic osseous lesions. The visualized upper abdominal bowel gas pattern is unremarkable. IMPRESSION: Negative chest x-ray. Electronically Signed   By: Malachy Moan M.D.   On: 01/10/2019 14:32    Procedures .Marland KitchenIncision and Drainage Date/Time: 01/10/2019 2:53 PM Performed by: Arlyn Dunning, PA-C Authorized by: Arlyn Dunning, PA-C   Consent:    Consent obtained:  Verbal   Consent given by:  Patient   Risks discussed:  Bleeding, incomplete drainage, pain and damage to other organs   Alternatives  discussed:  No treatment Universal protocol:    Procedure explained and questions answered to patient or proxy's satisfaction: yes     Relevant documents present and verified: yes     Patient identity confirmed:  Verbally with patient Location:    Type:  Abscess   Location:  Upper extremity   Upper extremity location:  Arm   Arm location:  R lower arm Pre-procedure details:    Skin preparation:  Betadine Anesthesia (see MAR for exact dosages):    Anesthesia method:  Local infiltration   Local anesthetic:  Lidocaine 1% w/o epi Procedure type:    Complexity:  Complex  Procedure details:    Incision types:  Single straight   Incision depth:  Subcutaneous   Scalpel blade:  11   Wound management:  Probed and deloculated, irrigated with saline and extensive cleaning   Drainage:  Purulent and bloody   Drainage amount:  Moderate   Packing materials:  None Post-procedure details:    Patient tolerance of procedure:  Tolerated well, no immediate complications   (including critical care time)  Medications Ordered in ED Medications  lidocaine (PF) (XYLOCAINE) 1 % injection 5 mL (5 mLs Infiltration Given 01/10/19 1421)  sodium chloride 0.9 % bolus 1,000 mL (1,000 mLs Intravenous New Bag/Given 01/10/19 1420)  sulfamethoxazole-trimethoprim (BACTRIM DS,SEPTRA DS) 800-160 MG per tablet 1 tablet (1 tablet Oral Given 01/10/19 1422)  cephALEXin (KEFLEX) capsule 250 mg (250 mg Oral Given 01/10/19 1422)     Initial Impression / Assessment and Plan / ED Course  I have reviewed the triage vital signs and the nursing notes.  Pertinent labs & imaging results that were available during my care of the patient were reviewed by me and considered in my medical decision making (see chart for details).  Clinical Course as of Jan 09 1525  Mon Jan 10, 2019  1458 Patient remains stable and 95% on room air. Tolerated I&D of abscess well. Labs and chest xray unremarkable. Will continue to monitor. Social worker  consult placed due to drug abuse and homelessness. In the past, patient has declined resources for help   [KM]  1522 Patient requesting to leave. Patient in NAD and normal vitals on RA. Discussed with Dr. Criss AlvineGoldston and seen by him as well. Plan agreed upon.    [KM]    Clinical Course User Index [KM] Arlyn DunningMcLean, Blandon Offerdahl A, PA-C         Final Clinical Impressions(s) / ED Diagnoses   Final diagnoses:  Accidental overdose of heroin, initial encounter [facility](HCC)  Homelessness  Abscess of arm    ED Discharge Orders         Ordered    sulfamethoxazole-trimethoprim (BACTRIM DS,SEPTRA DS) 800-160 MG tablet  2 times daily     01/10/19 1525    cephALEXin (KEFLEX) 500 MG capsule  4 times daily     01/10/19 1525           Jeral PinchMcLean, Erbie Arment A, PA-C 01/10/19 1526    Pricilla LovelessGoldston, Scott, MD 01/10/19 1648

## 2019-01-10 NOTE — ED Notes (Signed)
Bed: UJ81 Expected date:  Expected time:  Means of arrival:  Comments: EMS overdose

## 2019-06-07 ENCOUNTER — Emergency Department (HOSPITAL_COMMUNITY)
Admission: EM | Admit: 2019-06-07 | Discharge: 2019-06-07 | Disposition: A | Discharge status: Home / Self Care | Payer: Self-pay | Attending: Emergency Medicine | Admitting: Emergency Medicine

## 2019-06-07 ENCOUNTER — Encounter (HOSPITAL_COMMUNITY): Payer: Self-pay

## 2019-06-07 DIAGNOSIS — M25572 Pain in left ankle and joints of left foot: Secondary | ICD-10-CM | POA: Insufficient documentation

## 2019-06-07 DIAGNOSIS — M79604 Pain in right leg: Secondary | ICD-10-CM | POA: Insufficient documentation

## 2019-06-07 DIAGNOSIS — L989 Disorder of the skin and subcutaneous tissue, unspecified: Secondary | ICD-10-CM | POA: Insufficient documentation

## 2019-06-07 DIAGNOSIS — M25571 Pain in right ankle and joints of right foot: Secondary | ICD-10-CM | POA: Insufficient documentation

## 2019-06-07 DIAGNOSIS — M79605 Pain in left leg: Secondary | ICD-10-CM | POA: Insufficient documentation

## 2019-06-07 DIAGNOSIS — E8809 Other disorders of plasma-protein metabolism, not elsewhere classified: Secondary | ICD-10-CM | POA: Insufficient documentation

## 2019-06-07 DIAGNOSIS — M25472 Effusion, left ankle: Secondary | ICD-10-CM | POA: Insufficient documentation

## 2019-06-07 DIAGNOSIS — M25471 Effusion, right ankle: Secondary | ICD-10-CM | POA: Insufficient documentation

## 2019-06-07 DIAGNOSIS — Z590 Homelessness: Secondary | ICD-10-CM | POA: Insufficient documentation

## 2019-06-07 DIAGNOSIS — F17210 Nicotine dependence, cigarettes, uncomplicated: Secondary | ICD-10-CM | POA: Insufficient documentation

## 2019-06-07 LAB — RAPID URINE DRUG SCREEN, HOSP PERFORMED
Amphetamines: NOT DETECTED
Barbiturates: NOT DETECTED
Benzodiazepines: NOT DETECTED
Cocaine: POSITIVE — AB
Opiates: NOT DETECTED
Tetrahydrocannabinol: NOT DETECTED

## 2019-06-07 LAB — COMPREHENSIVE METABOLIC PANEL
ALT: 55 U/L — ABNORMAL HIGH (ref 0–44)
AST: 66 U/L — ABNORMAL HIGH (ref 15–41)
Albumin: 2.6 g/dL — ABNORMAL LOW (ref 3.5–5.0)
Alkaline Phosphatase: 88 U/L (ref 38–126)
Anion gap: 8 (ref 5–15)
BUN: 9 mg/dL (ref 6–20)
CO2: 25 mmol/L (ref 22–32)
Calcium: 8.1 mg/dL — ABNORMAL LOW (ref 8.9–10.3)
Chloride: 101 mmol/L (ref 98–111)
Creatinine, Ser: 0.62 mg/dL (ref 0.61–1.24)
GFR calc Af Amer: >60 mL/min (ref >60)
GFR calc non Af Amer: >60 mL/min (ref >60)
Glucose, Bld: 124 mg/dL — ABNORMAL HIGH (ref 70–99)
Potassium: 3.4 mmol/L — ABNORMAL LOW (ref 3.5–5.1)
Sodium: 134 mmol/L — ABNORMAL LOW (ref 135–145)
Total Bilirubin: 0.6 mg/dL (ref 0.3–1.2)
Total Protein: 7 g/dL (ref 6.5–8.1)

## 2019-06-07 LAB — CBC
HCT: 31.6 % — ABNORMAL LOW (ref 39.0–52.0)
Hemoglobin: 10.3 g/dL — ABNORMAL LOW (ref 13.0–17.0)
MCH: 29.5 pg (ref 26.0–34.0)
MCHC: 32.6 g/dL (ref 30.0–36.0)
MCV: 90.5 fL (ref 80.0–100.0)
Platelets: 369 10*3/uL (ref 150–400)
RBC: 3.49 MIL/uL — ABNORMAL LOW (ref 4.22–5.81)
RDW: 13.7 % (ref 11.5–15.5)
WBC: 10.8 10*3/uL — ABNORMAL HIGH (ref 4.0–10.5)
nRBC: 0 % (ref 0.0–0.2)

## 2019-06-07 MED ORDER — DOXYCYCLINE HYCLATE 100 MG PO TABS
100.0000 mg | ORAL_TABLET | Freq: Once | ORAL | Status: AC
  Administered 2019-06-07: 100 mg via ORAL
  Filled 2019-06-07: qty 1

## 2019-06-07 MED ORDER — DOXYCYCLINE HYCLATE 100 MG PO CAPS: 100.0000 mg | ORAL_CAPSULE | Freq: Two times a day (BID) | ORAL | 0 refills | Status: AC

## 2019-06-07 MED ORDER — POTASSIUM CHLORIDE CRYS ER 20 MEQ PO TBCR
40.0000 meq | EXTENDED_RELEASE_TABLET | Freq: Once | ORAL | Status: AC
  Administered 2019-06-07: 14:00:00 40 meq via ORAL
  Filled 2019-06-07: qty 2

## 2019-06-07 NOTE — Discharge Instructions (Addendum)
It was our pleasure to provide your ER care today - we hope that you feel better.  Keep skin lesions very clean. Take doxycycline (antibiotic) as prescribed.   Avoid any drug/alcohol use. Eat balanced diet, and consider supplementing nutrition with Boost, Ensure or other protein shake.   Follow up with primary care doctor in the next 1-2 weeks  - also follow up there regarding elevated liver function tests.   Return to ER if worse, new symptoms, fevers, trouble breathing, or other concern.

## 2019-06-07 NOTE — ED Notes (Signed)
Pt not happy with continuing to have swelling in his legs. Writer has explained his discharge instructions and he has verbalized he understands. He left commenting if we are not going to do anything here, he will go over to [facility]. He did not sign his discharge,

## 2019-06-07 NOTE — ED Triage Notes (Addendum)
Patient arrived via [facility] from back parking lot of [employer]. Patient is homeless.   C/O swelling to left and right calf down to ankle X 1 week.   Denies injuries or falling.   Dorsalis pedal pulses present but, week.  Obvious swelling to both legs.   Patient has multiple sores on his arms and back of neck that patient is picking at. About the size of a quarter.    A/ox4 Ambulatory with ems.

## 2019-06-07 NOTE — ED Notes (Addendum)
Patient given water soup.

## 2019-06-07 NOTE — ED Provider Notes (Signed)
[facility]   CSN: [id number] Arrival date & time: 06/07/19  1220     History   Chief Complaint Chief Complaint  Patient presents with  . Leg Pain    Bilateral  . Ankle Pain  . Rash    HPI Joseph Spence is a 48 y.o. male.     Patient arrives via EMS from [employer] parking lot, homeless. Patient c/o bilateral foot/ankle swelling in the past couple weeks, notes hx same. Denies specific injury. No shortness of breath. Patient denies chest pain or discomfort.  No fever. No cough or uri symptoms. No abd pain. Making normal amount urine. Denies fever or chills. Also notes a several skin lesions, flat 'sores', 5-7 mm diameter,  to upper back and forearms for past several weeks, areas area mildly sore, not particularly itchy. ?remote hx mrsa.   The history is provided by the patient.  Leg Pain Associated symptoms: no back pain, no fever and no neck pain   Ankle Pain Associated symptoms: no back pain, no fever and no neck pain   Rash Associated symptoms: no abdominal pain, no diarrhea, no fever, no headaches, no shortness of breath, no sore throat and not vomiting     Past Medical History:  Diagnosis Date  . Hep C w/o coma, chronic (HCC)   . Heroin abuse (HCC)   . MRSA (methicillin resistant staph aureus) culture positive   . RLS (restless legs syndrome)     Patient Active Problem List   Diagnosis Date Noted  . Drug overdose 02/14/2016  . Heroin abuse (HCC) 02/14/2016  . Chronic hepatitis C (HCC) 02/14/2016  . Depression 02/14/2016  . Heroin overdose (HCC) 02/14/2016  . Heroin dependence (HCC) 05/08/2014  . Abscess of buttock, left 03/13/2014  . Fever 03/12/2014  . Abscess, gluteal, left 03/12/2014  . Sepsis affecting skin 03/12/2014  . Hypokalemia 03/12/2014  . Cellulitis of right hand 03/12/2014  . Cellulitis of right little finger 03/12/2014    Past Surgical History:  Procedure Laterality Date  . APPENDECTOMY     . HERNIA REPAIR          Home Medications    Prior to Admission medications   Not on File    Family History Family History  Problem Relation Age of Onset  . Lymphoma Father     Social History Social History   Tobacco Use  . Smoking status: Current Every Day Smoker    Packs/day: 1.00    Types: Cigarettes  . Smokeless tobacco: Never Used  Substance Use Topics  . Alcohol use: Yes    Comment: "Once in a blue moon do I hardly drink:.   . Drug use: Yes    Comment: Clean for 80 some days but replased today     Allergies   Vicodin [hydrocodone-acetaminophen]   Review of Systems Review of Systems  Constitutional: Negative for chills and fever.  HENT: Negative for sore throat.   Eyes: Negative for pain and visual disturbance.  Respiratory: Negative for cough and shortness of breath.   Cardiovascular: Negative for chest pain.  Gastrointestinal: Negative for abdominal pain, diarrhea and vomiting.  Genitourinary: Negative for flank pain.  Musculoskeletal: Negative for back pain and neck pain.  Skin: Positive for rash.  Neurological: Negative for headaches.  Hematological: Does not bruise/bleed easily.  Psychiatric/Behavioral: Negative for confusion.     Physical Exam Updated Vital Signs BP 114/76   Pulse 63   Temp 98.1 F (36.7 C) (  Oral)   Resp 15   SpO2 97%   Physical Exam Vitals signs and nursing note reviewed.  Constitutional:      Appearance: Normal appearance. He is well-developed.  HENT:     Head: Atraumatic.     Nose: Nose normal.     Mouth/Throat:     Mouth: Mucous membranes are moist.     Pharynx: Oropharynx is clear.  Eyes:     General: No scleral icterus.    Conjunctiva/sclera: Conjunctivae normal.     Pupils: Pupils are equal, round, and reactive to light.  Neck:     Musculoskeletal: Normal range of motion and neck supple. No neck rigidity.     Trachea: No tracheal deviation.  Cardiovascular:     Rate and Rhythm: Normal rate and  regular rhythm.     Pulses: Normal pulses.     Heart sounds: Normal heart sounds. No murmur. No friction rub. No gallop.   Pulmonary:     Effort: Pulmonary effort is normal. No accessory muscle usage or respiratory distress.     Breath sounds: Normal breath sounds.  Abdominal:     General: Bowel sounds are normal. There is no distension.     Palpations: Abdomen is soft.     Tenderness: There is no abdominal tenderness. There is no guarding.  Genitourinary:    Comments: No cva tenderness. Musculoskeletal:     Comments: Mild symmetric, bil foot/ankle edema. No calf swelling or tenderness. Distal pulses palp bil.   Skin:    General: Skin is warm and dry.     Comments: Several small, 5-7 mm, papular, skin lesions/superficial sorees to upper back/base neck, bil forearms. No mm, no palms/sores. No cellulitis. No fluctuance or abscesses.   Neurological:     Mental Status: He is alert.     Comments: Alert, speech clear.   Psychiatric:        Mood and Affect: Mood normal.      ED Treatments / Results  Labs (all labs ordered are listed, but only abnormal results are displayed) Results for orders placed or performed during the hospital encounter of 06/07/19  CBC  Result Value Ref Range   WBC 10.8 (H) 4.0 - 10.5 K/uL   RBC 3.49 (L) 4.22 - 5.81 MIL/uL   Hemoglobin 10.3 (L) 13.0 - 17.0 g/dL   HCT 16.131.6 (L) 09.639.0 - 04.552.0 %   MCV 90.5 80.0 - 100.0 fL   MCH 29.5 26.0 - 34.0 pg   MCHC 32.6 30.0 - 36.0 g/dL   RDW 40.913.7 81.111.5 - 91.415.5 %   Platelets 369 150 - 400 K/uL   nRBC 0.0 0.0 - 0.2 %  Comprehensive metabolic panel  Result Value Ref Range   Sodium 134 (L) 135 - 145 mmol/L   Potassium 3.4 (L) 3.5 - 5.1 mmol/L   Chloride 101 98 - 111 mmol/L   CO2 25 22 - 32 mmol/L   Glucose, Bld 124 (H) 70 - 99 mg/dL   BUN 9 6 - 20 mg/dL   Creatinine, Ser 7.820.62 0.61 - 1.24 mg/dL   Calcium 8.1 (L) 8.9 - 10.3 mg/dL   Total Protein 7.0 6.5 - 8.1 g/dL   Albumin 2.6 (L) 3.5 - 5.0 g/dL   AST 66 (H) 15 - 41 U/L    ALT 55 (H) 0 - 44 U/L   Alkaline Phosphatase 88 38 - 126 U/L   Total Bilirubin 0.6 0.3 - 1.2 mg/dL   GFR calc non Af Amer >60 >60 mL/min  GFR calc Af Amer >60 >60 mL/min   Anion gap 8 5 - 15    EKG None  Radiology No results found.  Procedures Procedures (including critical care time)  Medications Ordered in ED Medications - No data to display   Initial Impression / Assessment and Plan / ED Course  I have reviewed the triage vital signs and the nursing notes.  Pertinent labs & imaging results that were available during my care of the patient were reviewed by me and considered in my medical decision making (see chart for details).  Labs sent.   Reviewed nursing notes and prior charts for additional history.   Po fluids.  Labs reviewed by me - k is sl low. kcl po.  Albumin level is also quite low - dependent edema felt likely related.   Patient has eaten/drank in ED. Afebrile. No increased wob.   Patient currently appears stable for d/c.   ?hx mrsa. rx doxycycline.   Rec pcp f/u.  Return precautions provided.     Final Clinical Impressions(s) / ED Diagnoses   Final diagnoses:  None    ED Discharge Orders    None       Lajean Saver, MD 06/07/19 1435

## 2019-07-04 ENCOUNTER — Emergency Department (HOSPITAL_COMMUNITY): Payer: Self-pay

## 2019-07-04 ENCOUNTER — Emergency Department (HOSPITAL_COMMUNITY)
Admission: EM | Admit: 2019-07-04 | Discharge: 2019-07-04 | Disposition: A | Discharge status: Home / Self Care | Payer: Self-pay | Attending: Emergency Medicine | Admitting: Emergency Medicine

## 2019-07-04 ENCOUNTER — Other Ambulatory Visit: Payer: Self-pay

## 2019-07-04 ENCOUNTER — Encounter (HOSPITAL_COMMUNITY): Payer: Self-pay | Admitting: Emergency Medicine

## 2019-07-04 DIAGNOSIS — F17210 Nicotine dependence, cigarettes, uncomplicated: Secondary | ICD-10-CM | POA: Insufficient documentation

## 2019-07-04 DIAGNOSIS — M25571 Pain in right ankle and joints of right foot: Secondary | ICD-10-CM | POA: Insufficient documentation

## 2019-07-04 MED ORDER — INDOMETHACIN 25 MG PO CAPS: 25.0000 mg | ORAL_CAPSULE | Freq: Three times a day (TID) | ORAL | 0 refills | Status: AC | PRN

## 2019-07-04 MED ORDER — IBUPROFEN 800 MG PO TABS
800.0000 mg | ORAL_TABLET | Freq: Once | ORAL | Status: AC
  Administered 2019-07-04: 800 mg via ORAL
  Filled 2019-07-04: qty 1

## 2019-07-04 MED ORDER — SULFAMETHOXAZOLE-TRIMETHOPRIM 800-160 MG PO TABS: 1.0000 | ORAL_TABLET | Freq: Two times a day (BID) | ORAL | 0 refills | Status: AC

## 2019-07-04 MED ORDER — SULFAMETHOXAZOLE-TRIMETHOPRIM 800-160 MG PO TABS
1.0000 | ORAL_TABLET | Freq: Once | ORAL | Status: AC
  Administered 2019-07-04: 1 via ORAL
  Filled 2019-07-04: qty 1

## 2019-07-04 NOTE — ED Notes (Signed)
Pt transported to xray 

## 2019-07-04 NOTE — ED Triage Notes (Signed)
Per EMS pt complaint of right ankle pain and swelling; seen in past for same. Warmed and swelling noted with triage.

## 2019-07-04 NOTE — ED Provider Notes (Signed)
[facility]-EMERGENCY DEPT Provider Note   CSN: [id number] Arrival date & time: 07/04/19  0341     History   Chief Complaint Chief Complaint  Patient presents with  . Ankle Pain    HPI Joseph Spence is a 48 y.o. male.     The history is provided by the patient.  Ankle Pain Location:  Ankle Time since incident:  2 months Injury: no   Ankle location:  R ankle Pain details:    Quality:  Aching   Radiates to:  Does not radiate   Severity:  Moderate   Onset quality:  Gradual   Timing:  Constant   Progression:  Unchanged Chronicity:  Chronic Dislocation: no   Foreign body present:  No foreign bodies Tetanus status:  Up to date Prior injury to area:  No Relieved by:  Nothing Worsened by:  Nothing Associated symptoms: no back pain, no decreased ROM, no fatigue, no fever, no itching, no muscle weakness, no neck pain, no numbness, no stiffness, no swelling and no tingling   Risk factors: no concern for non-accidental trauma   Seen for same one month ago and prescribed doxycycline for cellulitis.  States still has pain.  No trauma.  No f/c/r. Has been walking on it often.  No wounds.  No CP no SOB,    Past Medical History:  Diagnosis Date  . Hep C w/o coma, chronic (HCC)   . Heroin abuse (HCC)   . MRSA (methicillin resistant staph aureus) culture positive   . RLS (restless legs syndrome)     Patient Active Problem List   Diagnosis Date Noted  . Drug overdose 02/14/2016  . Heroin abuse (HCC) 02/14/2016  . Chronic hepatitis C (HCC) 02/14/2016  . Depression 02/14/2016  . Heroin overdose (HCC) 02/14/2016  . Heroin dependence (HCC) 05/08/2014  . Abscess of buttock, left 03/13/2014  . Fever 03/12/2014  . Abscess, gluteal, left 03/12/2014  . Sepsis affecting skin 03/12/2014  . Hypokalemia 03/12/2014  . Cellulitis of right hand 03/12/2014  . Cellulitis of right little finger 03/12/2014    Past Surgical History:  Procedure Laterality Date  .  APPENDECTOMY    . HERNIA REPAIR          Home Medications    Prior to Admission medications   Medication Sig Start Date End Date Taking? Authorizing Provider  doxycycline (VIBRAMYCIN) 100 MG capsule Take 1 capsule (100 mg total) by mouth 2 (two) times daily. 06/07/19   Cathren Laine, MD    Family History Family History  Problem Relation Age of Onset  . Lymphoma Father     Social History Social History   Tobacco Use  . Smoking status: Current Every Day Smoker    Packs/day: 1.00    Types: Cigarettes  . Smokeless tobacco: Never Used  Substance Use Topics  . Alcohol use: Yes    Comment: "Once in a blue moon do I hardly drink:.   . Drug use: Yes    Comment: Clean for 80 some days but replased today     Allergies   Vicodin [hydrocodone-acetaminophen]   Review of Systems Review of Systems  Constitutional: Negative for fatigue and fever.  HENT: Negative for congestion.   Eyes: Negative for visual disturbance.  Respiratory: Negative for cough and shortness of breath.   Cardiovascular: Negative for chest pain and palpitations.  Genitourinary: Negative for difficulty urinating.  Musculoskeletal: Positive for arthralgias. Negative for back pain, neck pain, neck stiffness and stiffness.  Skin:  Negative for itching, pallor, rash and wound.  Neurological: Negative for weakness.  Psychiatric/Behavioral: Negative for agitation.  All other systems reviewed and are negative.    Physical Exam Updated Vital Signs BP 123/80   Pulse 67   Temp 98 F (36.7 C) (Oral)   Resp 16   Ht 6' (1.829 m)   Wt 70.3 kg   SpO2 98%   BMI 21.02 kg/m   Physical Exam Vitals signs and nursing note reviewed.  Constitutional:      General: He is not in acute distress.    Appearance: He is normal weight.  HENT:     Head: Normocephalic and atraumatic.     Nose: Nose normal.  Eyes:     Extraocular Movements: Extraocular movements intact.     Conjunctiva/sclera: Conjunctivae normal.      Pupils: Pupils are equal, round, and reactive to light.  Neck:     Musculoskeletal: Normal range of motion and neck supple.  Cardiovascular:     Rate and Rhythm: Normal rate and regular rhythm.     Pulses: Normal pulses.     Heart sounds: Normal heart sounds.  Pulmonary:     Effort: Pulmonary effort is normal.     Breath sounds: Normal breath sounds.  Abdominal:     General: Abdomen is flat. Bowel sounds are normal.     Tenderness: There is no abdominal tenderness. There is no guarding or rebound.  Musculoskeletal: Normal range of motion.        General: No deformity.     Right ankle: Achilles tendon normal.     Left ankle: Normal. Achilles tendon normal.     Right lower leg: No edema.     Left lower leg: No edema.     Right foot: Normal.       Feet:  Skin:    General: Skin is warm and dry.     Capillary Refill: Capillary refill takes less than 2 seconds.  Neurological:     General: No focal deficit present.     Mental Status: He is alert and oriented to person, place, and time.     Deep Tendon Reflexes: Reflexes normal.  Psychiatric:        Mood and Affect: Mood normal.        Behavior: Behavior normal.      ED Treatments / Results  Labs (all labs ordered are listed, but only abnormal results are displayed) Labs Reviewed - No data to display  EKG None  Radiology Results for orders placed or performed during the hospital encounter of 06/07/19  Rapid urine drug screen (hospital performed)  Result Value Ref Range   Opiates NONE DETECTED NONE DETECTED   Cocaine POSITIVE (A) NONE DETECTED   Benzodiazepines NONE DETECTED NONE DETECTED   Amphetamines NONE DETECTED NONE DETECTED   Tetrahydrocannabinol NONE DETECTED NONE DETECTED   Barbiturates NONE DETECTED NONE DETECTED  CBC  Result Value Ref Range   WBC 10.8 (H) 4.0 - 10.5 K/uL   RBC 3.49 (L) 4.22 - 5.81 MIL/uL   Hemoglobin 10.3 (L) 13.0 - 17.0 g/dL   HCT 31.6 (L) 39.0 - 52.0 %   MCV 90.5 80.0 - 100.0 fL   MCH  29.5 26.0 - 34.0 pg   MCHC 32.6 30.0 - 36.0 g/dL   RDW 13.7 11.5 - 15.5 %   Platelets 369 150 - 400 K/uL   nRBC 0.0 0.0 - 0.2 %  Comprehensive metabolic panel  Result Value Ref Range  Sodium 134 (L) 135 - 145 mmol/L   Potassium 3.4 (L) 3.5 - 5.1 mmol/L   Chloride 101 98 - 111 mmol/L   CO2 25 22 - 32 mmol/L   Glucose, Bld 124 (H) 70 - 99 mg/dL   BUN 9 6 - 20 mg/dL   Creatinine, Ser 1.610.62 0.61 - 1.24 mg/dL   Calcium 8.1 (L) 8.9 - 10.3 mg/dL   Total Protein 7.0 6.5 - 8.1 g/dL   Albumin 2.6 (L) 3.5 - 5.0 g/dL   AST 66 (H) 15 - 41 U/L   ALT 55 (H) 0 - 44 U/L   Alkaline Phosphatase 88 38 - 126 U/L   Total Bilirubin 0.6 0.3 - 1.2 mg/dL   GFR calc non Af Amer >60 >60 mL/min   GFR calc Af Amer >60 >60 mL/min   Anion gap 8 5 - 15   Dg Ankle Complete Right  Result Date: 07/04/2019 CLINICAL DATA:  Right ankle pain EXAM: RIGHT ANKLE - COMPLETE 3+ VIEW COMPARISON:  04/22/2013 FINDINGS: Generalized soft tissue swelling. There is no evidence of fracture, dislocation, or joint effusion. No opaque foreign body. IMPRESSION: Soft tissue swelling without osseous abnormality. Electronically Signed   By: Marnee SpringJonathon  Watts M.D.   On: 07/04/2019 05:23    Procedures Procedures (including critical care time)  Medications Ordered in ED Medications  ibuprofen (ADVIL) tablet 800 mg (has no administration in time range)  sulfamethoxazole-trimethoprim (BACTRIM DS) 800-160 MG per tablet 1 tablet (has no administration in time range)     Will treat with antibiotics and NSAIDs.  I highly doubt DVT.  I suspect the patient did not take the last round of antibiotics.   Theodore Demarkimothy A Warwick was evaluated in Emergency Department on 07/04/2019 for the symptoms described in the history of present illness. He was evaluated in the context of the global COVID-19 pandemic, which necessitated consideration that the patient might be at risk for infection with the SARS-CoV-2 virus that causes COVID-19. Institutional protocols and  algorithms that pertain to the evaluation of patients at risk for COVID-19 are in a state of rapid change based on information released by regulatory bodies including the CDC and federal and state organizations. These policies and algorithms were followed during the patient's care in the ED.   Final Clinical Impressions(s) / ED Diagnoses   Return for intractable cough, coughing up blood,fevers >100.4 unrelieved by medication, shortness of breath, intractable vomiting, chest pain, shortness of breath, weakness,numbness, changes in speech, facial asymmetry,abdominal pain, passing out,Inability to tolerate liquids or food, cough, altered mental status or any concerns. No signs of systemic illness or infection. The patient is nontoxic-appearing on exam and vital signs are within normal limits.   I have reviewed the triage vital signs and the nursing notes. Pertinent labs &imaging results that were available during my care of the patient were reviewed by me and considered in my medical decision making (see chart for details).After history, exam, and medical workup I feel the patient has beenappropriately medically screened and is safe for discharge home. Pertinent diagnoses were discussed with the patient. Patient was given return precautions.   Cha Gomillion, MD 07/04/19 [phone]

## 2019-07-12 ENCOUNTER — Emergency Department (HOSPITAL_BASED_OUTPATIENT_CLINIC_OR_DEPARTMENT_OTHER)
Admit: 2019-07-12 | Discharge: 2019-07-12 | Disposition: A | Discharge status: Home / Self Care | Payer: Self-pay | Attending: Emergency Medicine | Admitting: Emergency Medicine

## 2019-07-12 ENCOUNTER — Emergency Department (HOSPITAL_COMMUNITY)
Admission: EM | Admit: 2019-07-12 | Discharge: 2019-07-12 | Discharge status: Against Medical Advice | Payer: Self-pay | Attending: Emergency Medicine | Admitting: Emergency Medicine

## 2019-07-12 ENCOUNTER — Encounter (HOSPITAL_COMMUNITY): Payer: Self-pay

## 2019-07-12 DIAGNOSIS — M7989 Other specified soft tissue disorders: Secondary | ICD-10-CM

## 2019-07-12 DIAGNOSIS — R52 Pain, unspecified: Secondary | ICD-10-CM

## 2019-07-12 DIAGNOSIS — L03116 Cellulitis of left lower limb: Secondary | ICD-10-CM | POA: Insufficient documentation

## 2019-07-12 DIAGNOSIS — Z79899 Other long term (current) drug therapy: Secondary | ICD-10-CM | POA: Insufficient documentation

## 2019-07-12 DIAGNOSIS — R609 Edema, unspecified: Secondary | ICD-10-CM

## 2019-07-12 DIAGNOSIS — L03115 Cellulitis of right lower limb: Secondary | ICD-10-CM

## 2019-07-12 DIAGNOSIS — F17210 Nicotine dependence, cigarettes, uncomplicated: Secondary | ICD-10-CM | POA: Insufficient documentation

## 2019-07-12 LAB — CBC WITH DIFFERENTIAL/PLATELET
Abs Immature Granulocytes: 0.08 10*3/uL — ABNORMAL HIGH (ref 0.00–0.07)
Basophils Absolute: 0 10*3/uL (ref 0.0–0.1)
Basophils Relative: 0 %
Eosinophils Absolute: 0.1 10*3/uL (ref 0.0–0.5)
Eosinophils Relative: 1 %
HCT: 32.8 % — ABNORMAL LOW (ref 39.0–52.0)
Hemoglobin: 10.6 g/dL — ABNORMAL LOW (ref 13.0–17.0)
Immature Granulocytes: 1 %
Lymphocytes Relative: 19 %
Lymphs Abs: 1.9 10*3/uL (ref 0.7–4.0)
MCH: 29.4 pg (ref 26.0–34.0)
MCHC: 32.3 g/dL (ref 30.0–36.0)
MCV: 90.9 fL (ref 80.0–100.0)
Monocytes Absolute: 1.5 10*3/uL — ABNORMAL HIGH (ref 0.1–1.0)
Monocytes Relative: 15 %
Neutro Abs: 6.6 10*3/uL (ref 1.7–7.7)
Neutrophils Relative %: 64 %
Platelets: 376 10*3/uL (ref 150–400)
RBC: 3.61 MIL/uL — ABNORMAL LOW (ref 4.22–5.81)
RDW: 17 % — ABNORMAL HIGH (ref 11.5–15.5)
WBC: 10.2 10*3/uL (ref 4.0–10.5)
nRBC: 0 % (ref 0.0–0.2)

## 2019-07-12 LAB — URINALYSIS, ROUTINE W REFLEX MICROSCOPIC
Bilirubin Urine: NEGATIVE
Glucose, UA: NEGATIVE mg/dL
Ketones, ur: NEGATIVE mg/dL
Nitrite: POSITIVE — AB
Protein, ur: NEGATIVE mg/dL
Specific Gravity, Urine: 1.015 (ref 1.005–1.030)
WBC, UA: >50 WBC/hpf — ABNORMAL HIGH (ref 0–5)
pH: 6 (ref 5.0–8.0)

## 2019-07-12 LAB — COMPREHENSIVE METABOLIC PANEL
ALT: 46 U/L — ABNORMAL HIGH (ref 0–44)
AST: 37 U/L (ref 15–41)
Albumin: 2.3 g/dL — ABNORMAL LOW (ref 3.5–5.0)
Alkaline Phosphatase: 388 U/L — ABNORMAL HIGH (ref 38–126)
Anion gap: 6 (ref 5–15)
BUN: 9 mg/dL (ref 6–20)
CO2: 28 mmol/L (ref 22–32)
Calcium: 7.9 mg/dL — ABNORMAL LOW (ref 8.9–10.3)
Chloride: 100 mmol/L (ref 98–111)
Creatinine, Ser: 0.76 mg/dL (ref 0.61–1.24)
GFR calc Af Amer: >60 mL/min (ref >60)
GFR calc non Af Amer: >60 mL/min (ref >60)
Glucose, Bld: 120 mg/dL — ABNORMAL HIGH (ref 70–99)
Potassium: 3.2 mmol/L — ABNORMAL LOW (ref 3.5–5.1)
Sodium: 134 mmol/L — ABNORMAL LOW (ref 135–145)
Total Bilirubin: 2.3 mg/dL — ABNORMAL HIGH (ref 0.3–1.2)
Total Protein: 7.1 g/dL (ref 6.5–8.1)

## 2019-07-12 LAB — LACTIC ACID, PLASMA: Lactic Acid, Venous: 0.9 mmol/L (ref 0.5–1.9)

## 2019-07-12 MED ORDER — SODIUM CHLORIDE 0.9 % IV BOLUS (SEPSIS)
500.0000 mL | Freq: Once | INTRAVENOUS | Status: AC
  Administered 2019-07-12: 18:00:00 500 mL via INTRAVENOUS

## 2019-07-12 MED ORDER — CEFAZOLIN SODIUM-DEXTROSE 1-4 GM/50ML-% IV SOLN
1.0000 g | Freq: Once | INTRAVENOUS | Status: AC
  Administered 2019-07-12: 1 g via INTRAVENOUS
  Filled 2019-07-12: qty 50

## 2019-07-12 MED ORDER — KETOROLAC TROMETHAMINE 30 MG/ML IJ SOLN
30.0000 mg | Freq: Once | INTRAMUSCULAR | Status: AC
  Administered 2019-07-12: 19:00:00 30 mg via INTRAVENOUS
  Filled 2019-07-12: qty 1

## 2019-07-12 MED ORDER — SODIUM CHLORIDE 0.9% FLUSH
3.0000 mL | Freq: Once | INTRAVENOUS | Status: AC
  Administered 2019-07-12: 18:00:00 3 mL via INTRAVENOUS

## 2019-07-12 MED ORDER — SODIUM CHLORIDE 0.9 % IV SOLN
1000.0000 mL | INTRAVENOUS | Status: DC
  Administered 2019-07-12: 18:00:00 1000 mL via INTRAVENOUS

## 2019-07-12 NOTE — Progress Notes (Signed)
Right lower extremity venous duplex complete. Preliminary results given to Dr. Tomi Bamberger @ 18:40 and can be found under CV Proc tab. Lita Mains- RDMS, RVT 6:41 PM  07/12/2019

## 2019-07-12 NOTE — ED Provider Notes (Addendum)
[facility]-EMERGENCY DEPT Provider Note   CSN: [id number] Arrival date & time: 07/12/19  1531     History   Chief Complaint Chief Complaint  Patient presents with   Arm Swelling   Leg Swelling    HPI Joseph Spence is a 48 y.o. male.     HPI Patient presents to the ED for evaluation of persistent leg swelling.  Patient has a history of homelessness, heroin abuse, and hepatitis C.  Patient states he started having swelling of his right lower leg and ankle over a week ago.  He was seen in the ED on the fifth and diagnosed with a cellulitis.  He was prescribed antibiotics.  Patient states he is unable to get the prescription because of the cost.  Over the last few days his ankle swelling has gotten worse.  He denies any fevers or chills.  No vomiting or diarrhea.  No chest pain or shortness of breath.  Patient also mentions a painless lump that is been in his right upper arm.  That has been there for a while.  He has not noticed any redness in that area Past Medical History:  Diagnosis Date   Hep C w/o coma, chronic (HCC)    Heroin abuse (HCC)    MRSA (methicillin resistant staph aureus) culture positive    RLS (restless legs syndrome)     Patient Active Problem List   Diagnosis Date Noted   Drug overdose 02/14/2016   Heroin abuse (HCC) 02/14/2016   Chronic hepatitis C (HCC) 02/14/2016   Depression 02/14/2016   Heroin overdose (HCC) 02/14/2016   Heroin dependence (HCC) 05/08/2014   Abscess of buttock, left 03/13/2014   Fever 03/12/2014   Abscess, gluteal, left 03/12/2014   Sepsis affecting skin 03/12/2014   Hypokalemia 03/12/2014   Cellulitis of right hand 03/12/2014   Cellulitis of right little finger 03/12/2014    Past Surgical History:  Procedure Laterality Date   APPENDECTOMY     HERNIA REPAIR          Home Medications    Prior to Admission medications   Medication Sig Start Date End Date Taking? Authorizing  Provider  indomethacin (INDOCIN) 25 MG capsule Take 1 capsule (25 mg total) by mouth 3 (three) times daily as needed. 07/04/19  Yes Palumbo, April, MD  doxycycline (VIBRAMYCIN) 100 MG capsule Take 1 capsule (100 mg total) by mouth 2 (two) times daily. Patient not taking: Reported on 07/04/2019 06/07/19   Cathren Laine, MD    Family History Family History  Problem Relation Age of Onset   Lymphoma Father     Social History Social History   Tobacco Use   Smoking status: Current Every Day Smoker    Packs/day: 1.00    Types: Cigarettes   Smokeless tobacco: Never Used  Substance Use Topics   Alcohol use: Yes    Comment: "Once in a blue moon do I hardly drink:.    Drug use: Yes    Comment: Clean for 80 some days but replased today     Allergies   Vicodin [hydrocodone-acetaminophen]   Review of Systems Review of Systems  All other systems reviewed and are negative.    Physical Exam Updated Vital Signs BP 123/76    Pulse 66    Temp 97.6 F (36.4 C) (Oral)    Resp 16    Wt 70.3 kg    SpO2 98%    BMI 21.02 kg/m   Physical Exam Vitals signs and  nursing note reviewed.  Constitutional:      Appearance: He is well-developed.     Comments: Disheveled  HENT:     Head: Normocephalic and atraumatic.     Right Ear: External ear normal.     Left Ear: External ear normal.  Eyes:     General: No scleral icterus.       Right eye: No discharge.        Left eye: No discharge.     Conjunctiva/sclera: Conjunctivae normal.  Neck:     Musculoskeletal: Neck supple.     Trachea: No tracheal deviation.  Cardiovascular:     Rate and Rhythm: Normal rate and regular rhythm.  Pulmonary:     Effort: Pulmonary effort is normal. No respiratory distress.     Breath sounds: Normal breath sounds. No stridor. No wheezing or rales.  Abdominal:     General: Bowel sounds are normal. There is no distension.     Palpations: Abdomen is soft.     Tenderness: There is no abdominal tenderness. There  is no guarding or rebound.  Musculoskeletal:        General: Tenderness present.     Right lower leg: Edema present.     Comments: Erythema and edema of the right lower leg below the knee, tenderness to palpation, no fluctuance; right bicep area with mobile soft tissue mass in the bicep region questionable old partial ruptured bicep muscle versus a lipoma, no erythema or induration  Skin:    General: Skin is warm and dry.     Findings: No rash.  Neurological:     Mental Status: He is alert.     Cranial Nerves: No cranial nerve deficit (no facial droop, extraocular movements intact, no slurred speech).     Sensory: No sensory deficit.     Motor: No abnormal muscle tone or seizure activity.     Coordination: Coordination normal.      ED Treatments / Results  Labs (all labs ordered are listed, but only abnormal results are displayed) Labs Reviewed  COMPREHENSIVE METABOLIC PANEL - Abnormal; Notable for the following components:      Result Value   Sodium 134 (*)    Potassium 3.2 (*)    Glucose, Bld 120 (*)    Calcium 7.9 (*)    Albumin 2.3 (*)    ALT 46 (*)    Alkaline Phosphatase 388 (*)    Total Bilirubin 2.3 (*)    All other components within normal limits  CBC WITH DIFFERENTIAL/PLATELET - Abnormal; Notable for the following components:   RBC 3.61 (*)    Hemoglobin 10.6 (*)    HCT 32.8 (*)    RDW 17.0 (*)    Monocytes Absolute 1.5 (*)    Abs Immature Granulocytes 0.08 (*)    All other components within normal limits  URINALYSIS, ROUTINE W REFLEX MICROSCOPIC - Abnormal; Notable for the following components:   Color, Urine AMBER (*)    APPearance HAZY (*)    Hgb urine dipstick SMALL (*)    Nitrite POSITIVE (*)    Leukocytes,Ua LARGE (*)    WBC, UA >50 (*)    Bacteria, UA MANY (*)    All other components within normal limits  LACTIC ACID, PLASMA  LACTIC ACID, PLASMA    EKG None  Radiology Vas [place] Lower Extremity Venous (dvt) (only [facility])  Result Date:  07/12/2019  Lower Venous Study Indications: Swelling, Edema, and Pain.  Performing Technologist: Levada Schilling RDMS,  RVT  Examination Guidelines: A complete evaluation includes B-mode imaging, spectral Doppler, color Doppler, and power Doppler as needed of all accessible portions of each vessel. Bilateral testing is considered an integral part of a complete examination. Limited examinations for reoccurring indications may be performed as noted.  +---------+---------------+---------+-----------+----------+--------------+  RIGHT     Compressibility Phasicity Spontaneity Properties Thrombus Aging  +---------+---------------+---------+-----------+----------+--------------+  CFV       Full            Yes       Yes                                    +---------+---------------+---------+-----------+----------+--------------+  SFJ       Full                                                             +---------+---------------+---------+-----------+----------+--------------+  FV Prox   Full                                                             +---------+---------------+---------+-----------+----------+--------------+  FV Mid    Full                                                             +---------+---------------+---------+-----------+----------+--------------+  FV Distal Full                                                             +---------+---------------+---------+-----------+----------+--------------+  PFV       Full                                                             +---------+---------------+---------+-----------+----------+--------------+  POP       Full            Yes       Yes                                    +---------+---------------+---------+-----------+----------+--------------+  PTV       Full                                                             +---------+---------------+---------+-----------+----------+--------------+  PERO      Full                                                              +---------+---------------+---------+-----------+----------+--------------+  GSV       Full                                                             +---------+---------------+---------+-----------+----------+--------------+   +----+---------------+---------+-----------+----------+--------------+  LEFT Compressibility Phasicity Spontaneity Properties Thrombus Aging  +----+---------------+---------+-----------+----------+--------------+  CFV  Full            Yes       Yes                                    +----+---------------+---------+-----------+----------+--------------+     Summary: Right: There is no evidence of deep vein thrombosis in the lower extremity. No cystic structure found in the popliteal fossa. Left: No evidence of common femoral vein obstruction.  *See table(s) above for measurements and observations.    Preliminary     Procedures Procedures (including critical care time)  Medications Ordered in ED Medications  sodium chloride 0.9 % bolus 500 mL (500 mLs Intravenous New Bag/Given 07/12/19 1804)    Followed by  0.9 %  sodium chloride infusion (1,000 mLs Intravenous New Bag/Given 07/12/19 1804)  ketorolac (TORADOL) 30 MG/ML injection 30 mg (has no administration in time range)  sodium chloride flush (NS) 0.9 % injection 3 mL (3 mLs Intravenous Given 07/12/19 1804)  ceFAZolin (ANCEF) IVPB 1 g/50 mL premix (1 g Intravenous New Bag/Given 07/12/19 1802)     Initial Impression / Assessment and Plan / ED Course  I have reviewed the triage vital signs and the nursing notes.  Pertinent labs & imaging results that were available during my care of the patient were reviewed by me and considered in my medical decision making (see chart for details).  Clinical Course as of Jul 11 1845  Tue Jul 12, 2019  1839 DVT study is negative for DVT.  UA abnormal suggests UTI.     [JK]  1840 CBC with stable anemia.   [JK]    Clinical Course User Index [JK] Linwood Dibbles,  MD     Pt presents with worsening leg swelling and pain, hc of IVDA. DC with cellulitis recently, has not been taking abx.  NO DVT on [place].  Exam not suggestive of abscess, or deep space infection.  Pt has poor access to health care.  Will consult for admission, iv abx.  Final Clinical Impressions(s) / ED Diagnoses   Final diagnoses:  Cellulitis of right lower extremity      Linwood Dibbles, MD 07/12/19 1846  consult was made with the hospitalist service.  Plan was to admit patient to hospital.  Patient initially agreed to admission.  At 1930 hrs. I was informed by the nurse the patient had decided he is going to leave.  Patient states he is a heroin addict and does not want to go into withdrawal.  Explained the  patient that we can certainly give him medications to help try to prevent and treat any withdrawal symptoms.  Patient still has decided to leave AMA.  He still has his prescriptions from the previous visit.   Dorie Rank, MD 07/12/19 [phone]

## 2019-07-12 NOTE — ED Notes (Signed)
PT is stating that he wants to leave and want to speak with MD. Tomi Bamberger notified. He is bedside with pt now.

## 2019-07-12 NOTE — ED Triage Notes (Signed)
Pt BIBA. Pt seen for the same last week. Pt has significant swelling to right leg and ankle that has gotten worse the last 3 days. Warmth and tenderness noted. Pt also has golf ball size knot on right bicep. Pt is IV drug user.

## 2019-07-15 LAB — URINE CULTURE: Culture: >=100000 — AB

## 2019-07-16 ENCOUNTER — Telehealth: Payer: Self-pay | Admitting: Emergency Medicine

## 2019-07-16 NOTE — Telephone Encounter (Signed)
Post ED Visit - Positive Culture Follow-up  Culture report reviewed by antimicrobial stewardship pharmacist: [place] Team []  Elenor Quinones, Pharm.D. []  Heide Guile, Pharm.D., BCPS AQ-ID []  Parks Neptune, Pharm.D., BCPS []  Alycia Rossetti, Pharm.D., BCPS []  [place], Pharm.D., BCPS, AAHIVP []  Legrand Como, Pharm.D., BCPS, AAHIVP []  Salome Arnt, PharmD, BCPS []  Johnnette Gourd, PharmD, BCPS []  Hughes Better, PharmD, BCPS []  Leeroy Cha, PharmD []  Laqueta Linden, PharmD, BCPS []  Albertina Parr, PharmD  [place] Team []  Leodis Sias, PharmD []  Lindell Spar, PharmD []  Royetta Asal, PharmD []  Graylin Shiver, Rph []  Rema Fendt) Glennon Mac, PharmD []  Arlyn Dunning, PharmD []  Netta Cedars, PharmD []  Dia Sitter, PharmD []  Leone Haven, PharmD []  Gretta Arab, PharmD []  Theodis Shove, PharmD []  Peggyann Juba, PharmD [x]  Reuel Boom, PharmD   Positive urine culture No further patient follow-up is required at this time.  Sandi Raveling Nadalie Laughner 07/16/2019, 3:54 PM

## 2020-06-27 ENCOUNTER — Encounter (HOSPITAL_COMMUNITY): Payer: Self-pay

## 2020-06-27 ENCOUNTER — Other Ambulatory Visit: Payer: Self-pay

## 2020-06-27 ENCOUNTER — Emergency Department (HOSPITAL_COMMUNITY)
Admission: EM | Admit: 2020-06-27 | Discharge: 2020-06-27 | Disposition: A | Discharge status: Home / Self Care | Payer: Self-pay | Attending: Emergency Medicine | Admitting: Emergency Medicine

## 2020-06-27 DIAGNOSIS — Z5900 Homelessness unspecified: Secondary | ICD-10-CM

## 2020-06-27 DIAGNOSIS — F17210 Nicotine dependence, cigarettes, uncomplicated: Secondary | ICD-10-CM | POA: Insufficient documentation

## 2020-06-27 DIAGNOSIS — R456 Violent behavior: Secondary | ICD-10-CM | POA: Insufficient documentation

## 2020-06-27 DIAGNOSIS — F1910 Other psychoactive substance abuse, uncomplicated: Secondary | ICD-10-CM | POA: Insufficient documentation

## 2020-06-27 NOTE — ED Notes (Signed)
Patient escorted out by security

## 2020-06-27 NOTE — ED Provider Notes (Signed)
[facility] Provider Note   CSN: [id number] Arrival date & time: 06/27/20  1651     History Chief Complaint  Patient presents with  . Drug Overdose  . Aggressive Behavior    Joseph Spence is a 49 y.o. male.  49 year old male with past medical history below including IV drug use, previous drug overdoses, hepatitis C who presents with altered mental status.  EMS picked the patient up off of the street where he was reportedly unresponsive and not answering questions.  EMS notes that he became agonal with pinpoint pupils so they gave him 2 mg of Narcan with improvement.  He has since become uncooperative and aggressive.  He eventually pulled out his IV.  Blood glucose 108 in route.  Vital signs normal.  LEVEL 5 CAVEAT DUE TO AMS  The history is provided by the EMS personnel.  Drug Overdose       Past Medical History:  Diagnosis Date  . Hep C w/o coma, chronic (HCC)   . Heroin abuse (HCC)   . MRSA (methicillin resistant staph aureus) culture positive   . RLS (restless legs syndrome)     Patient Active Problem List   Diagnosis Date Noted  . Drug overdose 02/14/2016  . Heroin abuse (HCC) 02/14/2016  . Chronic hepatitis C (HCC) 02/14/2016  . Depression 02/14/2016  . Heroin overdose (HCC) 02/14/2016  . Heroin dependence (HCC) 05/08/2014  . Abscess of buttock, left 03/13/2014  . Fever 03/12/2014  . Abscess, gluteal, left 03/12/2014  . Sepsis affecting skin 03/12/2014  . Hypokalemia 03/12/2014  . Cellulitis of right hand 03/12/2014  . Cellulitis of right [place] Piltz finger 03/12/2014    Past Surgical History:  Procedure Laterality Date  . APPENDECTOMY    . HERNIA REPAIR         Family History  Problem Relation Age of Onset  . Lymphoma Father     Social History   Tobacco Use  . Smoking status: Current Every Day Smoker    Packs/day: 1.00    Types: Cigarettes  . Smokeless tobacco: Never Used  Substance Use Topics  . Alcohol  use: Yes    Comment: "Once in a blue moon do I hardly drink:.   . Drug use: Yes    Comment: Clean for 80 some days but replased today    Home Medications Prior to Admission medications   Medication Sig Start Date End Date Taking? Authorizing Provider  doxycycline (VIBRAMYCIN) 100 MG capsule Take 1 capsule (100 mg total) by mouth 2 (two) times daily. Patient not taking: Reported on 07/04/2019 06/07/19   Cathren Laine, MD  indomethacin (INDOCIN) 25 MG capsule Take 1 capsule (25 mg total) by mouth 3 (three) times daily as needed. 07/04/19   Palumbo, April, MD    Allergies    Vicodin [hydrocodone-acetaminophen]  Review of Systems   Review of Systems  Unable to perform ROS: Mental status change    Physical Exam Updated Vital Signs BP (!) 138/97   Pulse 90   Resp 15   SpO2 92%   Physical Exam Vitals and nursing note reviewed.  Constitutional:      General: He is not in acute distress.    Appearance: He is well-developed.     Comments: Altered, intermittently yelling or moaning  HENT:     Head: Normocephalic and atraumatic.  Eyes:     Conjunctiva/sclera: Conjunctivae normal.  Cardiovascular:     Rate and Rhythm: Normal rate.  Pulmonary:  Effort: Pulmonary effort is normal.  Chest:     Chest wall: No tenderness.  Abdominal:     General: There is no distension.  Musculoskeletal:        General: No signs of injury.     Cervical back: Neck supple.  Skin:    General: Skin is warm and dry.     Comments: Multiple scabs and excorations on arms  Neurological:     Comments: Altered, moving all 4 extremities equally  Psychiatric:        Attention and Perception: He is inattentive.        Speech: He is noncommunicative.        Behavior: Behavior is uncooperative.     Comments: Dirty, disheveled     ED Results / Procedures / Treatments   Labs (all labs ordered are listed, but only abnormal results are displayed) Labs Reviewed - No data to  display  EKG None  Radiology No results found.  Procedures Procedures (including critical care time)  Medications Ordered in ED Medications - No data to display  ED Course  I have reviewed the triage vital signs and the nursing notes.      MDM Rules/Calculators/A&P                          PT observed for 4.5 hours after arrival during which time VS remained normal and he did not require any doses of narcan in the ED. On reassessment, he was sleeping and reluctant to get up but with encouragement was able to answer questions. Denies complaints and did not want to be here in the ED. Discharged with stable VS. Final Clinical Impression(s) / ED Diagnoses Final diagnoses:  Polysubstance abuse [facility])  Homelessness    Rx / DC Orders ED Discharge Orders    None       Brianne Maina, Ambrose [place], MD 06/27/20 2126

## 2020-06-27 NOTE — ED Notes (Signed)
Assumed care of patient at this time, nad noted, sr up x2, bed locked and low, call bell w/I reach.  Will continue to monitor. ° °

## 2020-06-27 NOTE — ED Triage Notes (Addendum)
Pt BIB EMS from the street. Pt became unresponsive and not answering questions. Pt became agonal with pinpoint pupils so 2 of Narcan was given. Pt has been uncooperative and aggressive with EMS and pulled out his IV.   CBG 108 BP 137/94 HR 78 RR 14 93% RA

## 2020-08-29 DEATH — deceased

## 2021-07-17 IMAGING — CR DG ANKLE COMPLETE 3+V*R*
3 series · 3 of 3 positions shown · non-contrast
Comparison: 04/22/2013

CLINICAL DATA: Right ankle pain

EXAM:
RIGHT ANKLE - COMPLETE 3+ VIEW

[x ankle ap right]
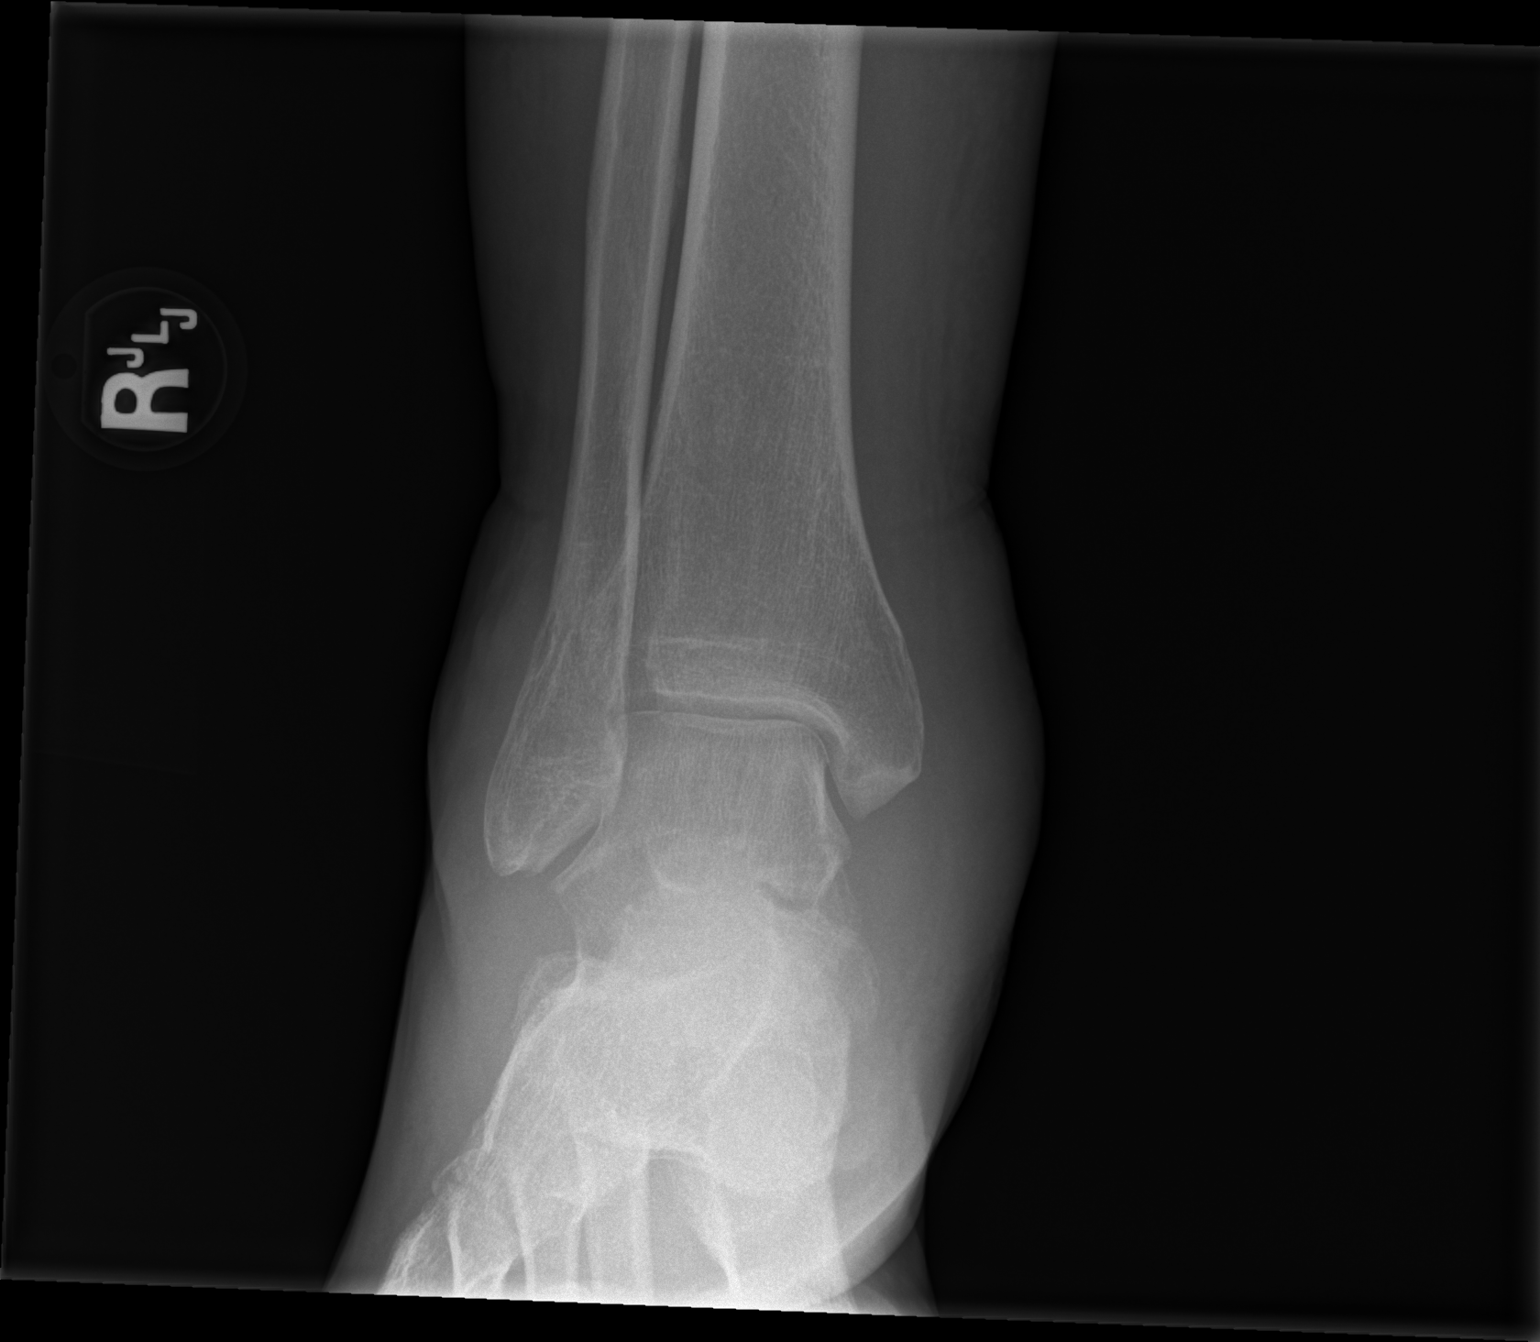

[x ankle obl right]
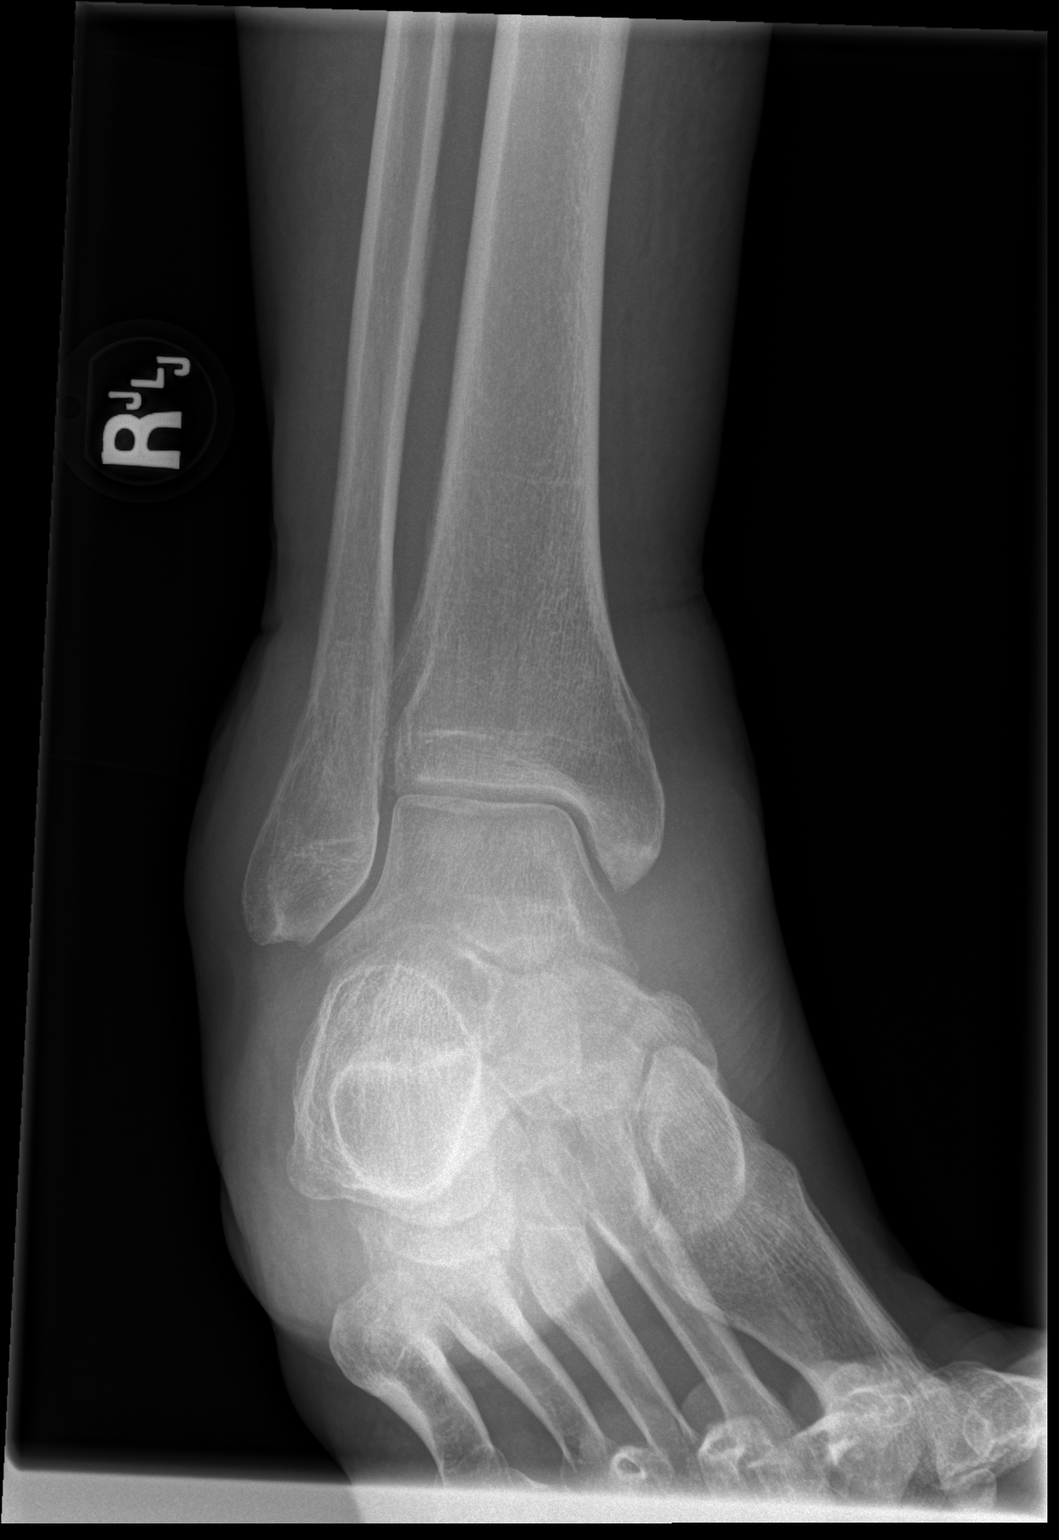

[x ankle lat right]
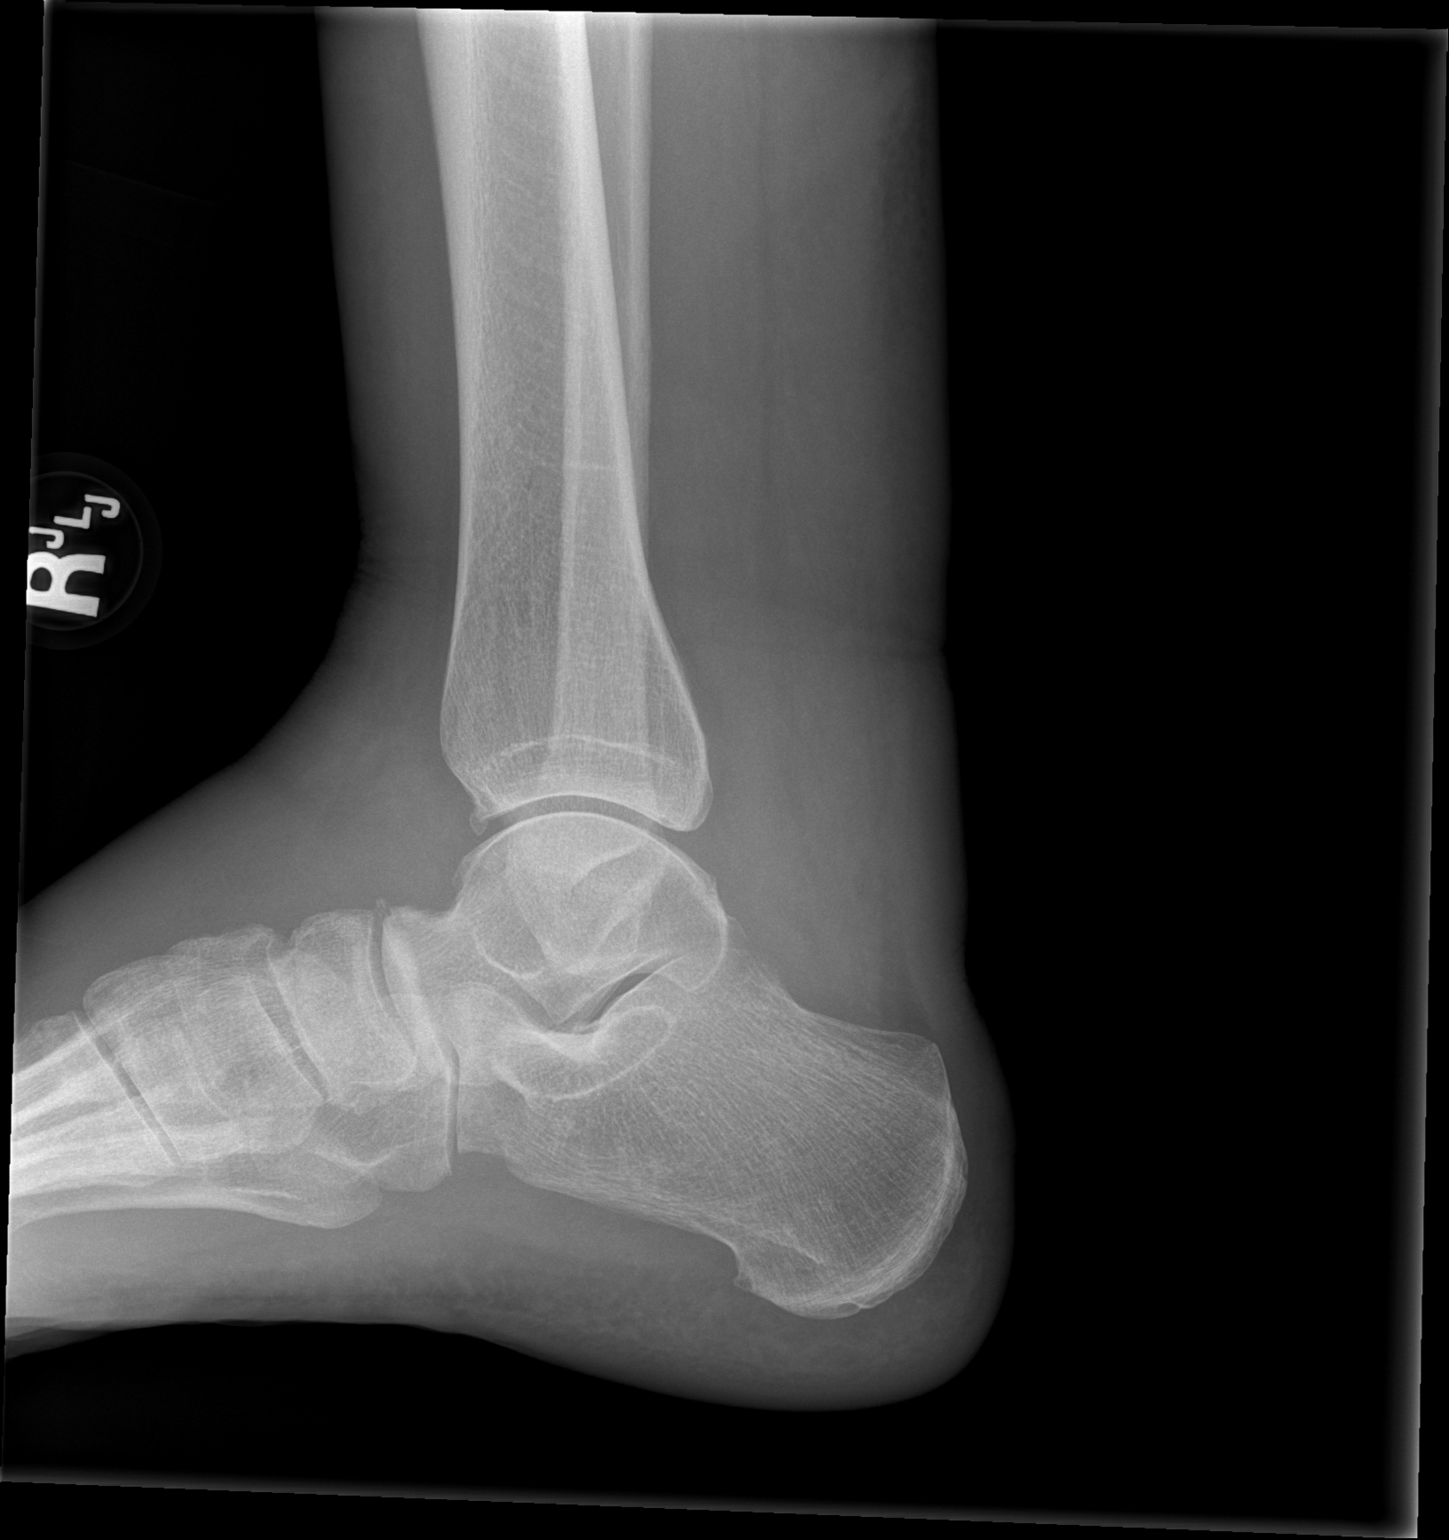

[3 of 3 positions shown; findings below may reference images not displayed]

FINDINGS: Generalized soft tissue swelling. There is no evidence of fracture,
dislocation, or joint effusion. No opaque foreign body.
IMPRESSION: Soft tissue swelling without osseous abnormality.
